# Patient Record
Sex: Female | Born: 1985 | Race: White | Hispanic: No | Marital: Married | Smoking: Current every day smoker
Health system: Southern US, Community
[De-identification: ages and names within clinical notes are randomized; demographics above are authoritative.]

## PROBLEM LIST (undated history)

## (undated) DIAGNOSIS — F1011 Alcohol abuse, in remission: Secondary | ICD-10-CM

## (undated) DIAGNOSIS — K219 Gastro-esophageal reflux disease without esophagitis: Secondary | ICD-10-CM

## (undated) DIAGNOSIS — I429 Cardiomyopathy, unspecified: Secondary | ICD-10-CM

## (undated) DIAGNOSIS — Z5189 Encounter for other specified aftercare: Secondary | ICD-10-CM

## (undated) DIAGNOSIS — I219 Acute myocardial infarction, unspecified: Secondary | ICD-10-CM

## (undated) DIAGNOSIS — F1110 Opioid abuse, uncomplicated: Secondary | ICD-10-CM

## (undated) HISTORY — PX: DILATION AND CURETTAGE OF UTERUS: SHX78

## (undated) HISTORY — DX: Encounter for other specified aftercare: Z51.89

## (undated) HISTORY — DX: Gastro-esophageal reflux disease without esophagitis: K21.9

---
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
## 2006-06-18 ENCOUNTER — Emergency Department (HOSPITAL_COMMUNITY): Admission: EM | Admit: 2006-06-18 | Discharge: 2006-06-18 | Payer: Self-pay | Admitting: Emergency Medicine

## 2007-03-25 ENCOUNTER — Emergency Department (HOSPITAL_COMMUNITY): Admission: EM | Admit: 2007-03-25 | Discharge: 2007-03-25 | Payer: Self-pay | Admitting: Emergency Medicine

## 2007-06-05 ENCOUNTER — Emergency Department (HOSPITAL_COMMUNITY): Admission: EM | Admit: 2007-06-05 | Discharge: 2007-06-06 | Payer: Self-pay | Admitting: Emergency Medicine

## 2007-08-01 ENCOUNTER — Observation Stay (HOSPITAL_COMMUNITY): Admission: EM | Admit: 2007-08-01 | Discharge: 2007-08-02 | Payer: Self-pay | Admitting: Emergency Medicine

## 2007-08-12 ENCOUNTER — Emergency Department (HOSPITAL_COMMUNITY): Admission: EM | Admit: 2007-08-12 | Discharge: 2007-08-13 | Payer: Self-pay | Admitting: Emergency Medicine

## 2008-03-10 ENCOUNTER — Emergency Department (HOSPITAL_COMMUNITY): Admission: EM | Admit: 2008-03-10 | Discharge: 2008-03-10 | Payer: Self-pay | Admitting: Emergency Medicine

## 2008-04-25 ENCOUNTER — Emergency Department (HOSPITAL_COMMUNITY): Admission: EM | Admit: 2008-04-25 | Discharge: 2008-04-25 | Payer: Self-pay | Admitting: Emergency Medicine

## 2008-05-26 ENCOUNTER — Emergency Department (HOSPITAL_COMMUNITY): Admission: EM | Admit: 2008-05-26 | Discharge: 2008-05-26 | Payer: Self-pay | Admitting: Family Medicine

## 2008-07-15 ENCOUNTER — Emergency Department (HOSPITAL_COMMUNITY): Admission: EM | Admit: 2008-07-15 | Discharge: 2008-07-15 | Payer: Self-pay | Admitting: *Deleted

## 2008-08-20 ENCOUNTER — Emergency Department (HOSPITAL_COMMUNITY): Admission: EM | Admit: 2008-08-20 | Discharge: 2008-08-20 | Payer: Self-pay | Admitting: Emergency Medicine

## 2008-09-22 ENCOUNTER — Emergency Department (HOSPITAL_BASED_OUTPATIENT_CLINIC_OR_DEPARTMENT_OTHER): Admission: EM | Admit: 2008-09-22 | Discharge: 2008-09-22 | Payer: Self-pay | Admitting: Emergency Medicine
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
## 2009-08-31 ENCOUNTER — Ambulatory Visit: Payer: Self-pay | Admitting: Radiology

## 2009-08-31 ENCOUNTER — Emergency Department (HOSPITAL_BASED_OUTPATIENT_CLINIC_OR_DEPARTMENT_OTHER): Admission: EM | Admit: 2009-08-31 | Discharge: 2009-08-31 | Payer: Self-pay | Admitting: Emergency Medicine

## 2010-11-13 LAB — DIFFERENTIAL
Basophils Relative: 1 % (ref 0–1)
Lymphocytes Relative: 34 % (ref 12–46)
Lymphs Abs: 1.6 10*3/uL (ref 0.7–4.0)
Monocytes Relative: 9 % (ref 3–12)
Neutro Abs: 2.7 10*3/uL (ref 1.7–7.7)
Neutrophils Relative %: 55 % (ref 43–77)

## 2010-11-13 LAB — URINALYSIS, ROUTINE W REFLEX MICROSCOPIC
Glucose, UA: NEGATIVE mg/dL
Hgb urine dipstick: NEGATIVE
Ketones, ur: 40 mg/dL — AB
Leukocytes, UA: NEGATIVE
Nitrite: NEGATIVE
Protein, ur: 30 mg/dL — AB
Specific Gravity, Urine: 1.027 (ref 1.005–1.030)
Urobilinogen, UA: 1 mg/dL (ref 0.0–1.0)
pH: 6.5 (ref 5.0–8.0)

## 2010-11-13 LAB — POCT I-STAT, CHEM 8
BUN: <3 mg/dL — ABNORMAL LOW (ref 6–23)
Chloride: 104 mEq/L (ref 96–112)
Creatinine, Ser: 0.7 mg/dL (ref 0.4–1.2)
Glucose, Bld: 79 mg/dL (ref 70–99)
HCT: 43 % (ref 36.0–46.0)
Potassium: 3.8 mEq/L (ref 3.5–5.1)

## 2010-11-13 LAB — CBC
RBC: 4.25 MIL/uL (ref 3.87–5.11)
WBC: 4.8 10*3/uL (ref 4.0–10.5)

## 2010-11-13 LAB — URINE MICROSCOPIC-ADD ON

## 2010-11-13 LAB — RAPID URINE DRUG SCREEN, HOSP PERFORMED
Amphetamines: NOT DETECTED
Barbiturates: NOT DETECTED
Benzodiazepines: POSITIVE — AB
Opiates: NOT DETECTED
Tetrahydrocannabinol: POSITIVE — AB

## 2010-11-13 LAB — POCT PREGNANCY, URINE: Preg Test, Ur: NEGATIVE

## 2010-11-14 LAB — PREGNANCY, URINE: Preg Test, Ur: NEGATIVE

## 2010-12-12 NOTE — H&P (Signed)
[name], [name]            ACCOUNT NO.:  [id number]   MEDICAL RECORD NO.:  [id number]          PATIENT TYPE:  INP   LOCATION:  1823                         FACILITY:  [facility]   PHYSICIAN:  Marcellus [place], MD     DATE OF BIRTH:  25-Mar-1986   DATE OF ADMISSION:  07/31/2007  DATE OF DISCHARGE:                              HISTORY & PHYSICAL   PRIMARY CARE PHYSICIAN:  Karyl Kinnier, M.D., in [facility], [place], [place].   PSYCHIATRIST:  Vear Clock, M.D.   CHIEF COMPLAINT:  1. Overdose on Benadryl with alcohol.  2. Attempted suicide.   HISTORY OF PRESENT ILLNESS:  [name] is a 25 year old Caucasian  female patient with history of bipolar disorder, panic disorder, who was  in her usual state of health until 6:49 p.m. when she ingested 24  tablets of 25 mg each of Benadryl with a small amount of alcohol.  She  claims that she has been depressed secondary to her personal relations  and wanted to get away from all of that and [name] to sleep.  She is unsure  if she really attempted to commit suicide.  In any event,  within 10  minutes,  when her friend,  June,  got there,  she mentioned what she  had done to her following which her friend brought her to the emergency  room.  In the emergency room, the patient initially had some drowsiness,  dry mouth, palpitations and feeling jittery.  However,  now,  she says  her drowsiness has resolved and so has her jitteriness and palpitations.  The patient still has some dry mouth.  She denies any other complaints.  She denies any homicidal ideations,  illusions or hallucinations.   PAST MEDICAL HISTORY:  Question hypoglycemia as a child.   PAST SURGICAL HISTORY:  Right ear surgery as a baby.   PSYCHIATRIC HISTORY:  1. Bipolar disorder.  2. Panic disorder.  3. Insomnia.   ALLERGIES:  Latex.  No known drug allergies.   MEDICATIONS:  1. Wellbutrin XL 150 mg p.o. daily.  2. Depakote ER 1000 mg p.o.  q.h.s.  3. Ambien 5 mg  p.o. q.h.s.  4. Klonopin 1 mg p.o. b.i.d.   FAMILY HISTORY:  His mother who is said to be dying from hepatitis C,  heart disease and liver disease.   SOCIAL HISTORY:  The patient is single.  She lives with her roommates.  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  She smokes half to 1 pack  of cigarettes per day for the last 5 years.  She also smokes pot.  She  denies regular alcohol intake.   REVIEW OF SYSTEMS:  Fourteen systems reviewed and apart from history of  present illness is noncontributory.   PHYSICAL EXAMINATION:  [name] is a pleasant moderately built and  nourished female, patient in no obvious distress.  VITAL SIGNS:  Temperature 98.7 degrees Fahrenheit, blood pressure 117/70  mmHg, pulse which was in the 140s on arrival is in the 80s and regular  at this time, respirations 16 per minute, saturating at 100%  on room  air.  HEAD, EYES, ENT:  Nontraumatic, normocephalic.  Pupils equally reacting  to light and accommodation.  NECK:  Without JVD, carotid bruit, lymphadenopathy, or goiter.  Supple.  LYMPHATICS:  No lymphadenopathy.  BREAST EXAM:  Deferred.  RESPIRATORY SYSTEM:  Clear to auscultation.  CARDIOVASCULAR SYSTEM:  First and second heart sounds heard.  No third  or fourth heart sounds or murmurs.  CENTRAL NERVOUS SYSTEM:  The patient is awake, alert, oriented times 3  with no focal neurological deficits.  EXTREMITIES:  No cyanosis, clubbing, edema.  Peripheral pulses are  symmetrically felt.  SKIN:  Normal warmth and moisture.  MUSCULOSKELETAL SYSTEM:  Noncontributory.  Abdomen; nondisteded, nontender. No organomegaly/mass felt. Bowel sounds  normal.   LABS:  Urine drug screen positive for amphetamines, opiates,  tetrahydrocannabinol.  Serum salicylate level, acetaminophen levels  normal.  Blood alcohol  level 17 mg per dL.  Comprehensive metabolic  panel is only remarkable for glucose of 66.  Her BUN is 6, creatinine   0.65.  Urine pregnancy test is negative.  CBCs are unremarkable.   ASSESSMENT AND PLAN:  1. Drug overdose of Benadryl with alcohol.  The patient still with      some anticholinergic side effects.  Will admit to telemetry for      observation and supportive management.  ED physician has discussed      her case with Poison Control who have not indicated any active      interventions.  2. Attempted suicide.  Will place on suicide precautions and one-on-      one sitter.  Will obtain psychiatric consult.  3. Polysubstance abuse-for counseling.  4. Tobacco abuse-for cessation counseling and nicotine patch.  5. Bipolar disorder-to continue the patient's home medications.      Marcellus [place], MD  Electronically Signed     AH/MEDQ  D:  08/01/2007  T:  08/01/2007  Job:  [id number]   cc:   Karyl Kinnier
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
## 2011-04-19 LAB — CBC
HCT: 31.6 — ABNORMAL LOW
HCT: 39
Hemoglobin: 11 — ABNORMAL LOW
Hemoglobin: 13.3
MCHC: 34
MCHC: 35
MCV: 94.2
MCV: 94.7
MCV: 96.1
Platelets: 244
Platelets: 274
RBC: 3.36 — ABNORMAL LOW
RBC: 4.06
RBC: 4.67
RDW: 12.9
WBC: 7.6
WBC: 7.9

## 2011-04-19 LAB — DIFFERENTIAL
Basophils Absolute: 0
Basophils Relative: 1
Eosinophils Absolute: 0
Eosinophils Absolute: 0
Eosinophils Relative: 0
Eosinophils Relative: 1
Lymphocytes Relative: 29
Lymphocytes Relative: 37
Lymphs Abs: 2.2
Lymphs Abs: 2.9
Monocytes Absolute: 0.6
Monocytes Absolute: 0.7
Monocytes Relative: 9
Neutro Abs: 4.2
Neutrophils Relative %: 53

## 2011-04-19 LAB — COMPREHENSIVE METABOLIC PANEL
ALT: 13
ALT: 8
AST: 26
Albumin: 5.5 — ABNORMAL HIGH
BUN: 4 — ABNORMAL LOW
CO2: 27
Calcium: 10.2
Calcium: 8.4
Chloride: 102
Creatinine, Ser: 0.65
Creatinine, Ser: 0.73
GFR calc Af Amer: >60
GFR calc non Af Amer: >60
Glucose, Bld: 84
Sodium: 137
Total Bilirubin: 1

## 2011-04-19 LAB — URINALYSIS, ROUTINE W REFLEX MICROSCOPIC
Glucose, UA: NEGATIVE
Hgb urine dipstick: NEGATIVE
Ketones, ur: 15 — AB
Nitrite: NEGATIVE
Protein, ur: NEGATIVE
Specific Gravity, Urine: 1.028
Urobilinogen, UA: 1
pH: 7

## 2011-04-19 LAB — I-STAT 8, (EC8 V) (CONVERTED LAB)
BUN: 11
Bicarbonate: 28.6 — ABNORMAL HIGH
HCT: 43
Hemoglobin: 14.6
Operator id: 196461
Sodium: 139
TCO2: 30

## 2011-04-19 LAB — RAPID URINE DRUG SCREEN, HOSP PERFORMED
Amphetamines: NOT DETECTED
Benzodiazepines: NOT DETECTED
Benzodiazepines: POSITIVE — AB
Cocaine: NOT DETECTED
Cocaine: NOT DETECTED
Opiates: POSITIVE — AB
Tetrahydrocannabinol: POSITIVE — AB
Tetrahydrocannabinol: POSITIVE — AB

## 2011-04-19 LAB — POCT PREGNANCY, URINE
Operator id: 19646
Preg Test, Ur: NEGATIVE

## 2011-04-19 LAB — POCT I-STAT CREATININE: Creatinine, Ser: 0.8

## 2011-04-19 LAB — D-DIMER, QUANTITATIVE: D-Dimer, Quant: 0.23

## 2011-04-30 LAB — WET PREP, GENITAL: WBC, Wet Prep HPF POC: NONE SEEN

## 2011-04-30 LAB — POCT URINALYSIS DIP (DEVICE)
Nitrite: NEGATIVE
Protein, ur: NEGATIVE
pH: 6.5

## 2011-04-30 LAB — GC/CHLAMYDIA PROBE AMP, GENITAL: Chlamydia, DNA Probe: NEGATIVE

## 2011-04-30 LAB — POCT PREGNANCY, URINE: Preg Test, Ur: NEGATIVE

## 2011-05-08 LAB — DIFFERENTIAL
Basophils Relative: 0
Monocytes Absolute: 0.8 — ABNORMAL HIGH
Monocytes Relative: 7
Neutro Abs: 8.3 — ABNORMAL HIGH

## 2011-05-08 LAB — COMPREHENSIVE METABOLIC PANEL
Alkaline Phosphatase: 55
BUN: 8
CO2: 25
GFR calc non Af Amer: >60
Glucose, Bld: 91
Potassium: 4.3
Total Bilirubin: 0.7
Total Protein: 7.4

## 2011-05-08 LAB — CBC
HCT: 40.9
Hemoglobin: 14.3
RBC: 4.32
RDW: 12.3

## 2011-05-08 LAB — RAPID URINE DRUG SCREEN, HOSP PERFORMED
Amphetamines: NOT DETECTED
Barbiturates: NOT DETECTED
Benzodiazepines: NOT DETECTED
Opiates: NOT DETECTED

## 2011-05-08 LAB — ETHANOL: Alcohol, Ethyl (B): 48 — ABNORMAL HIGH

## 2011-05-31 ENCOUNTER — Emergency Department (HOSPITAL_COMMUNITY)
Admission: EM | Admit: 2011-05-31 | Discharge: 2011-05-31 | Disposition: A | Discharge status: Home / Self Care | Payer: Medicaid Other | Attending: Emergency Medicine | Admitting: Emergency Medicine

## 2011-05-31 DIAGNOSIS — T148XXA Other injury of unspecified body region, initial encounter: Secondary | ICD-10-CM | POA: Insufficient documentation

## 2011-05-31 DIAGNOSIS — F411 Generalized anxiety disorder: Secondary | ICD-10-CM | POA: Insufficient documentation

## 2012-10-20 ENCOUNTER — Encounter (HOSPITAL_BASED_OUTPATIENT_CLINIC_OR_DEPARTMENT_OTHER): Payer: Self-pay

## 2012-10-20 ENCOUNTER — Emergency Department (HOSPITAL_BASED_OUTPATIENT_CLINIC_OR_DEPARTMENT_OTHER)
Admission: EM | Admit: 2012-10-20 | Discharge: 2012-10-20 | Disposition: A | Discharge status: Home / Self Care | Payer: Self-pay | Attending: Emergency Medicine | Admitting: Emergency Medicine

## 2012-10-20 DIAGNOSIS — R05 Cough: Secondary | ICD-10-CM | POA: Insufficient documentation

## 2012-10-20 DIAGNOSIS — Z79899 Other long term (current) drug therapy: Secondary | ICD-10-CM | POA: Insufficient documentation

## 2012-10-20 DIAGNOSIS — J069 Acute upper respiratory infection, unspecified: Secondary | ICD-10-CM | POA: Insufficient documentation

## 2012-10-20 DIAGNOSIS — J3489 Other specified disorders of nose and nasal sinuses: Secondary | ICD-10-CM | POA: Insufficient documentation

## 2012-10-20 DIAGNOSIS — Z8679 Personal history of other diseases of the circulatory system: Secondary | ICD-10-CM | POA: Insufficient documentation

## 2012-10-20 DIAGNOSIS — R059 Cough, unspecified: Secondary | ICD-10-CM | POA: Insufficient documentation

## 2012-10-20 DIAGNOSIS — J209 Acute bronchitis, unspecified: Secondary | ICD-10-CM | POA: Insufficient documentation

## 2012-10-20 DIAGNOSIS — F17200 Nicotine dependence, unspecified, uncomplicated: Secondary | ICD-10-CM | POA: Insufficient documentation

## 2012-10-20 DIAGNOSIS — R51 Headache: Secondary | ICD-10-CM | POA: Insufficient documentation

## 2012-10-20 DIAGNOSIS — J029 Acute pharyngitis, unspecified: Secondary | ICD-10-CM | POA: Insufficient documentation

## 2012-10-20 HISTORY — DX: Cardiomyopathy, unspecified: I42.9

## 2012-10-20 HISTORY — DX: Opioid abuse, uncomplicated: F11.10

## 2012-10-20 HISTORY — DX: Alcohol abuse, in remission: F10.11

## 2012-10-20 MED ORDER — IBUPROFEN 800 MG PO TABS
800.0000 mg | ORAL_TABLET | Freq: Once | ORAL | Status: AC
  Administered 2012-10-20: 800 mg via ORAL
  Filled 2012-10-20: qty 1

## 2012-10-20 MED ORDER — PREDNISONE 50 MG PO TABS
60.0000 mg | ORAL_TABLET | Freq: Once | ORAL | Status: AC
  Administered 2012-10-20: 60 mg via ORAL
  Filled 2012-10-20: qty 1

## 2012-10-20 MED ORDER — ALBUTEROL SULFATE (5 MG/ML) 0.5% IN NEBU
2.5000 mg | INHALATION_SOLUTION | Freq: Once | RESPIRATORY_TRACT | Status: AC
  Administered 2012-10-20: 2.5 mg via RESPIRATORY_TRACT
  Filled 2012-10-20: qty 0.5

## 2012-10-20 MED ORDER — ALBUTEROL SULFATE HFA 108 (90 BASE) MCG/ACT IN AERS
2.0000 | INHALATION_SPRAY | RESPIRATORY_TRACT | Status: DC | PRN
Start: 2012-10-20 — End: 2012-10-20
  Administered 2012-10-20: 2 via RESPIRATORY_TRACT
  Filled 2012-10-20: qty 6.7

## 2012-10-20 MED ORDER — IPRATROPIUM BROMIDE 0.02 % IN SOLN
0.5000 mg | Freq: Once | RESPIRATORY_TRACT | Status: AC
Start: 2012-10-20 — End: 2012-10-20
  Administered 2012-10-20: 0.5 mg via RESPIRATORY_TRACT
  Filled 2012-10-20: qty 2.5

## 2012-10-20 MED ORDER — PREDNISONE 20 MG PO TABS: 60.0000 mg | ORAL_TABLET | Freq: Every day | ORAL | Status: DC

## 2012-10-20 MED ORDER — ACETAMINOPHEN 325 MG PO TABS
650.0000 mg | ORAL_TABLET | Freq: Once | ORAL | Status: AC
  Administered 2012-10-20: 650 mg via ORAL
  Filled 2012-10-20: qty 2

## 2012-10-20 NOTE — ED Notes (Signed)
RT Note: Patient was instructed on proper MDI use with spacer. Patient demonstrated technique well and currently is in no apparent respiratory distress. Rt will continue to monitor.

## 2012-10-20 NOTE — ED Provider Notes (Signed)
History     CSN: [id number]  Arrival date & time 10/20/12  0908   First MD Initiated Contact with Patient 10/20/12 [phone]      Chief Complaint  Patient presents with  . URI    (Consider location/radiation/quality/duration/timing/severity/associated sxs/prior treatment) Patient is a 27 y.o. female presenting with URI. The history is provided by the patient.  URI She has been sick for the last 2 days with sore throat, nonproductive cough, nasal congestion, and headache. She has not had fever or chills but she did have sweats on one occasion. Denies nausea or vomiting or diarrhea. There's been some mild mid abdominal pain. She denies arthralgias or myalgias. She denies dyspnea. She denies sick contacts. She does smoke one pack of cigarettes a day. She is using over-the-counter cough syrups and Mucinex with no relief.  Past Medical History  Diagnosis Date  . H/O ETOH abuse   . Cardiomyopathy   . Opiate abuse, episodic     Past Surgical History  Procedure Laterality Date  . Dilation and curettage of uterus      History reviewed. No pertinent family history.  History  Substance Use Topics  . Smoking status: Current Every Day Smoker -- 1.00 packs/day for 10 years    Types: Cigarettes  . Smokeless tobacco: Never Used  . Alcohol Use: No     Comment: etoh abuse (recovering since 10/02/2012)    OB History   Grav Para Term Preterm Abortions TAB SAB Ect Mult Living                  Review of Systems  All other systems reviewed and are negative.    Allergies  Latex  Home Medications   Current Outpatient Rx  Name  Route  Sig  Dispense  Refill  . Citalopram Hydrobromide (CELEXA PO)   Oral   Take by mouth daily.         Marland Kitchen gabapentin (NEURONTIN) 300 MG capsule   Oral   Take 300 mg by mouth 3 (three) times daily.         . QUEtiapine (SEROQUEL) 100 MG tablet   Oral   Take 100 mg by mouth at bedtime.         Marland Kitchen levonorgestrel (MIRENA) 20 MCG/24HR IUD  Intrauterine   1 each by Intrauterine route once.           BP 128/74  Pulse 104  Temp(Src) 97.7 F (36.5 C) (Oral)  Resp 18  Ht 5\' 5"  (1.651 m)  Wt 150 lb (68.04 kg)  BMI 24.96 kg/m2  SpO2 100%  Physical Exam  Nursing note and vitals reviewed.  27 year old female, resting comfortably and in no acute distress. Vital signs are significant for borderline tachycardia with heart rate 104. Oxygen saturation is 100%, which is normal. Head is normocephalic and atraumatic. PERRLA, EOMI. Oropharynx shows mild tonsillar hypertrophy without exudate. Neck is nontender and supple without adenopathy or JVD. Back is nontender and there is no CVA tenderness. Lungs are clear without rales, wheezes, or rhonchi. Prolonged exhalation phase is noted. Chest is nontender. Heart has regular rate and rhythm without murmur. Abdomen is soft, flat, nontender without masses or hepatosplenomegaly and peristalsis is normoactive. Extremities have no cyanosis or edema, full range of motion is present. Skin is warm and dry without rash. Neurologic: Mental status is normal, cranial nerves are intact, there are no motor or sensory deficits.  ED Course  Procedures (including critical care time)  Results for  orders placed during the hospital encounter of 10/20/12  RAPID STREP SCREEN      Result Value Range   Streptococcus, Group A Screen (Direct) NEGATIVE  NEGATIVE      1. Upper respiratory infection   2. Acute bronchitis       MDM  Upper respiratory infection with bronchitis. Strep screen will be obtained and she'll be given a therapeutic trial of albuterol with Atrovent.  She had significant relief with albuterol with Atrovent. She'll be given an albuterol inhaler to use at home. She is given a prescription for prednisone and is to use over-the-counter ibuprofen and acetaminophen for pain.  Dione Booze, MD 10/20/12 1040

## 2012-10-20 NOTE — ED Notes (Signed)
Pt states that she has dry non productive cough x2 days, congestion of head and chest, denies nausea, c/o mild abdominal pain and chest wall pain when coughing, and generalized malaise.

## 2013-10-13 ENCOUNTER — Emergency Department (HOSPITAL_COMMUNITY)
Admission: EM | Admit: 2013-10-13 | Discharge: 2013-10-13 | Disposition: A | Discharge status: Home / Self Care | Payer: Medicaid Other | Attending: Emergency Medicine | Admitting: Emergency Medicine

## 2013-10-13 ENCOUNTER — Encounter (HOSPITAL_COMMUNITY): Payer: Self-pay | Admitting: Emergency Medicine

## 2013-10-13 DIAGNOSIS — Z91040 Latex allergy status: Secondary | ICD-10-CM | POA: Insufficient documentation

## 2013-10-13 DIAGNOSIS — Z79899 Other long term (current) drug therapy: Secondary | ICD-10-CM | POA: Insufficient documentation

## 2013-10-13 DIAGNOSIS — I252 Old myocardial infarction: Secondary | ICD-10-CM | POA: Insufficient documentation

## 2013-10-13 DIAGNOSIS — F17200 Nicotine dependence, unspecified, uncomplicated: Secondary | ICD-10-CM | POA: Insufficient documentation

## 2013-10-13 DIAGNOSIS — F1120 Opioid dependence, uncomplicated: Secondary | ICD-10-CM | POA: Insufficient documentation

## 2013-10-13 DIAGNOSIS — IMO0002 Reserved for concepts with insufficient information to code with codable children: Secondary | ICD-10-CM | POA: Insufficient documentation

## 2013-10-13 HISTORY — DX: Acute myocardial infarction, unspecified: I21.9

## 2013-10-13 NOTE — Progress Notes (Signed)
[facility] to help establish primary care. Also, spoke with patient about [facility] and explained that they would help patient with substance abuse counseling. Patient is drowsy and kept dosing off during conversation.
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
## 2013-10-13 NOTE — Progress Notes (Signed)
   CARE MANAGEMENT ED NOTE 10/13/2013  Patient:  Dianne DunRANSEAU,Leela N   Account Number:  [id number]  Date Initiated:  10/13/2013  Documentation initiated by:  Edd ArbourGIBBS,KIMBERLY  Subjective/Objective Assessment:   28 yr [place] pt without a pcp listed Pt confirmed with ED CM that she no longer has medicaid Pt agreed to a referral to [facility] staff c/o wanting detox O2 sat 89%     Subjective/Objective Assessment Detail:   used Heroin approx 45 minutes ago. patient is drowsy States boyfriend with dx hep c     Action/Plan:   spoke with pt [facility]   Action/Plan Detail:   Anticipated DC Date:       Status Recommendation to Physician:   Result of Recommendation:    Other ED Services  Consult Working Plan    DC Planning Services  Other  PCP issues  Outpatient Services - Pt will follow up  [facility]new[facility]    Choice offered to / List presented to:            Status of service:  Completed, signed off  ED Comments:   ED Comments Detail:

## 2013-10-13 NOTE — Discharge Instructions (Signed)
Finding Treatment for Alcohol and Drug Addiction It can be hard to find the right place to get professional treatment. Here are some important things to consider:  There are different types of treatment to choose from.  Some programs are live-in (residential) while others are not (outpatient). Sometimes a combination is offered.  No single type of program is right for everyone.  Most treatment programs involve a combination of education, counseling, and a 12-step, spiritually-based approach.  There are non-spiritually based programs (not 12-step).  Some treatment programs are government sponsored. They are geared for patients without private insurance.  Treatment programs can vary in many respects such as:  Cost and types of insurance accepted.  Types of on-site medical services offered.  Length of stay, setting, and size.  Overall philosophy of treatment. A person may need specialized treatment or have needs not addressed by all programs. For example, adolescents need treatment appropriate for their age. Other people have secondary disorders that must be managed as well. Secondary conditions can include mental illness, such as depression or diabetes. Often, a period of detoxification from alcohol or drugs is needed. This requires medical supervision and not all programs offer this. THINGS TO CONSIDER WHEN SELECTING A TREATMENT PROGRAM   Is the program certified by the appropriate government agency? Even private programs must be certified and employ certified professionals.  Does the program accept your insurance? If not, can a payment plan be set up?  Is the facility clean, organized, and well run? Do they allow you to speak with graduates who can share their treatment experience with you? Can you tour the facility? Can you meet with staff?  Does the program meet the full range of individual needs?  Does the treatment program address sexual orientation and physical disabilities?  Do they provide age, gender, and culturally appropriate treatment services?  Is treatment available in languages other than English?  Is long-term aftercare support or guidance encouraged and provided?  Is assessment of an individual's treatment plan ongoing to ensure it meets changing needs?  Does the program use strategies to encourage reluctant patients to remain in treatment long enough to increase the likelihood of success?  Does the program offer counseling (individual or group) and other behavioral therapies?  Does the program offer medicine as part of the treatment regimen, if needed?  Is there ongoing monitoring of possible relapse? Is there a defined relapse prevention program? Are services or referrals offered to family members to ensure they understand addiction and the recovery process? This would help them support the recovering individual.  Are 12-step meetings held at the center or is transport available for patients to attend outside meetings? In countries outside of the [place]. and [place], Magazine features editor for contact information for services in your area. Document Released: 06/14/2005 Document Revised: 10/08/2011 Document Reviewed: 12/25/2007 [facility] Patient Information 2014 [place], [place].      Behavioral Health Resources in the [facility]  Intensive Outpatient Programs: [facility]      [address] [place], [place] [phone] Both a day and evening program       [facility] Outpatient     [address]        [place], [place] [address] [phone]         ADS: [facility] [address] [place] [phone]  [facility] ACCESS LINE: [phone] or [phone] [address] [place], [place] [address] [email or web address]  Mobile  Crisis Teams:                                        [facility]         Mobile Crisis Care  Unit [phone]             [facility] [address] [place] [phone]                                         Interventionist [address] [place] [phone]  Self-Help/Support Groups: [facility] [place] Variety of support groups [phone] (call for more info)  Narcotics Anonymous (NA) [facility] [address] [place] - 2 meetings at this location  Residential Treatment Programs:  [facility]      [address]        [place]       [phone]         [facility] [address], [place] [address] [place], [place]  [address] [phone]  [facility]  [address] [place], [place] [address] [phone] Admissions: 8am-3pm M-F  [facility]     [address]        [place], [place] [address]       [phone]         [facility] [address] [place], [place] [phone]  [facility] [address] [place], [place] [phone]  [facility] - Intensive Outpatient      [address]     [place], [place]       [phone]         [facility] ([facility]     [address] [place], [place] [phone] or [phone]  [facility] ([facility])  [address] [place], [place] [phone]  [facility] [address]                                               [address] [place] [phone]  [facility]: [phone]- [phone]               General Therapy                                                Angie FavaJulie Brannon, PhD        [address]                                       [place], [place] [address]         [phone]   Insurance  [facility]   [address] [place], [place] [address] [phone]  [facility] [address]  [place], [place] [address] [phone] Insurance/Medicaid/sponsorship through [facility]  [address]                                        [place], [place] [address]    Therapy/tele-psych/case         [phone]          [facility] [address]   [place], [place]  [address]  Adolescent/group home/case management [phone]                                           Creola CornJulia Brannon PhD       General therapy       Insurance   [phone]         Dr. Lolly MustacheArfeen Insurance [phone] M-F  [place] [facility] Medicaid, sponsorship [phone]

## 2013-10-13 NOTE — ED Provider Notes (Addendum)
CSN: [id number]     Arrival date & time 10/13/13  1340 History   First MD Initiated Contact with Patient 10/13/13 1402     Chief Complaint  Patient presents with  . Medical Clearance    heroin overdose     (Consider location/radiation/quality/duration/timing/severity/associated sxs/prior Treatment) The history is provided by the patient and the police.   patient here requesting heroin detox. Last use was 45 minutes ago. States that she was not attempting to take her life in that she was only wanting to become intoxicated. Denies alcohol use. Her friends called EMS and I spoke with them and she did not receive heroin. She is sleepy but easily arousable. Denies any chest pain or dyspnea. No abdominal pain.  Past Medical History  Diagnosis Date  . H/O ETOH abuse   . Cardiomyopathy   . Opiate abuse, episodic   . MI (myocardial infarction)    Past Surgical History  Procedure Laterality Date  . Dilation and curettage of uterus     Family History  Problem Relation Age of Onset  . Family history unknown: Yes   History  Substance Use Topics  . Smoking status: Current Every Day Smoker -- 1.00 packs/day for 10 years    Types: Cigarettes  . Smokeless tobacco: Never Used  . Alcohol Use: No     Comment: etoh abuse (recovering since 10/02/2012)   OB History   Grav Para Term Preterm Abortions TAB SAB Ect Mult Living                 Review of Systems  All other systems reviewed and are negative.      Allergies  Latex  Home Medications   Current Outpatient Rx  Name  Route  Sig  Dispense  Refill  . Citalopram Hydrobromide (CELEXA PO)   Oral   Take by mouth daily.         Marland Kitchen gabapentin (NEURONTIN) 300 MG capsule   Oral   Take 300 mg by mouth 3 (three) times daily.         Marland Kitchen levonorgestrel (MIRENA) 20 MCG/24HR IUD   Intrauterine   1 each by Intrauterine route once.         . predniSONE (DELTASONE) 20 MG tablet   Oral   Take 3 tablets (60 mg total) by mouth daily.   15 tablet   0   . QUEtiapine (SEROQUEL) 100 MG tablet   Oral   Take 100 mg by mouth at bedtime.          BP 113/69  Pulse 102  Temp(Src) 98 F (36.7 C) (Oral)  Resp 18  SpO2 89% Physical Exam  Nursing note and vitals reviewed. Constitutional: She is oriented to person, place, and time. She appears well-developed and well-nourished.  Non-toxic appearance. No distress.  HENT:  Head: Normocephalic and atraumatic.  Eyes: Conjunctivae, EOM and lids are normal. Pupils are equal, round, and reactive to light.  Neck: Normal range of motion. Neck supple. No tracheal deviation present. No mass present.  Cardiovascular: Normal rate, regular rhythm and normal heart sounds.  Exam reveals no gallop.   No murmur heard. Pulmonary/Chest: Effort normal and breath sounds normal. No stridor. No respiratory distress. She has no decreased breath sounds. She has no wheezes. She has no rhonchi. She has no rales.  Abdominal: Soft. Normal appearance and bowel sounds are normal. She exhibits no distension. There is no tenderness. There is no rebound and no CVA tenderness.  Musculoskeletal: Normal range  of motion. She exhibits no edema and no tenderness.  Neurological: She is alert and oriented to person, place, and time. She has normal strength. No cranial nerve deficit or sensory deficit. GCS eye subscore is 4. GCS verbal subscore is 5. GCS motor subscore is 6.  Skin: Skin is warm and dry. No abrasion and no rash noted.  Psychiatric: Her affect is blunt. Her speech is delayed. She is slowed. She expresses no suicidal plans.    ED Course  Procedures (including critical care time) Labs Review Labs Reviewed  CBC WITH DIFFERENTIAL  COMPREHENSIVE METABOLIC PANEL  URINE RAPID DRUG SCREEN (HOSP PERFORMED)  ETHANOL   Imaging Review No results found.   EKG Interpretation None      MDM   Final diagnoses:  None    Patient to be given outpatient resources for opiate detox. No acute psychiatric  emergency present. Will be discharged when stable.  3:23 PM Patient reassessed and is able to get sounds at this time. Pulse oximetry stable. She is stable for discharge   Toy BakerAnthony T Ariam Mol, MD 10/13/13 1412  Toy BakerAnthony T Treyshaun Keatts, MD 10/13/13 1524

## 2014-05-19 ENCOUNTER — Ambulatory Visit (INDEPENDENT_AMBULATORY_CARE_PROVIDER_SITE_OTHER): Payer: Self-pay | Admitting: Family

## 2014-05-19 ENCOUNTER — Encounter: Payer: Self-pay | Admitting: Family

## 2014-05-19 VITALS — BP 148/92 | HR 110 | Temp 98.5°F | Resp 20 | Ht 64.0 in | Wt 157.1 lb

## 2014-05-19 DIAGNOSIS — R000 Tachycardia, unspecified: Secondary | ICD-10-CM

## 2014-05-19 DIAGNOSIS — F419 Anxiety disorder, unspecified: Secondary | ICD-10-CM

## 2014-05-19 DIAGNOSIS — M545 Low back pain, unspecified: Secondary | ICD-10-CM | POA: Insufficient documentation

## 2014-05-19 MED ORDER — OXYCODONE HCL 30 MG PO TABS: 30.0000 mg | ORAL_TABLET | ORAL | Status: DC | PRN

## 2014-05-19 MED ORDER — ALPRAZOLAM 1 MG PO TABS: 1.0000 mg | ORAL_TABLET | Freq: Every evening | ORAL | Status: DC | PRN

## 2014-05-19 NOTE — Assessment & Plan Note (Signed)
HR still remains tachycardic. Currently taking propanolol. Cannot rule out tachycardia as a result of anxiety. If symptoms persist will perform an EKG. Continue current propanolol at this time.
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
## 2014-05-19 NOTE — Assessment & Plan Note (Signed)
Continues to experience pain even with current dose of oxycodone. [facility]. Refer to pain specialist for maintenance of medication. Controlled substances agreement signed.
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
## 2014-05-19 NOTE — Patient Instructions (Signed)
Thank you for choosing [facility].  Summary/Instructions:   You should hear back in about a week regarding pain management if you have not please let us know.  Continue to take your medication as needed.  Please make an appointment for a physical at your convenience.  Thank you for enrolling in MyChart. Please follow the instructions below to securely access your online medical record. MyChart allows you to send messages to your doctor, view your test results, renew your prescriptions, schedule appointments, and more.  How Do I Sign Up? 1. In your Internet browser, go to [email or web address]. 2. Click on the New  User? link in the Sign In box.  3. Enter your MyChart Access Code exactly as it appears below. You will not need to use this code after you have completed the sign-up process. If you do not sign up before the expiration date, you must request a new code. MyChart Access Code: [email or web address] Expires: 07/18/2014 10:03 AM  4. Enter the last four digits of your Social Security Number (xxxx) and Date of Birth (mm/dd/yyyy) as indicated and click Next. You will be taken to the next sign-up page. 5. Create a MyChart ID. This will be your MyChart login ID and cannot be changed, so think of one that is secure and easy to remember. 6. Create a MyChart password. You can change your password at any time. 7. Enter your Password Reset Question and Answer and click Next. This can be used at a later time if you forget your password.  8. Select your communication preference, and if applicable enter your e-mail address. You will receive e-mail notification when new information is available in MyChart by choosing to receive e-mail notifications and filling in your e-mail. 9. Click Sign In. You can now view your medical record.   Additional Information If you have questions, you can email [email or web address] or call [phone] to talk to our MyChart staff.  Remember, MyChart is NOT to be used for urgent needs. For medical emergencies, dial 911.

## 2014-05-20 ENCOUNTER — Telehealth: Payer: Self-pay | Admitting: Family

## 2014-05-20 NOTE — Telephone Encounter (Signed)
emmi mailed  °

## 2014-06-10 ENCOUNTER — Other Ambulatory Visit: Payer: Self-pay

## 2014-06-10 ENCOUNTER — Other Ambulatory Visit: Payer: Medicaid Other

## 2014-06-10 ENCOUNTER — Ambulatory Visit (INDEPENDENT_AMBULATORY_CARE_PROVIDER_SITE_OTHER): Payer: Self-pay | Admitting: Family

## 2014-06-10 ENCOUNTER — Encounter: Payer: Self-pay | Admitting: Family

## 2014-06-10 VITALS — BP 164/112 | HR 116 | Temp 97.9°F | Resp 18 | Ht 64.0 in | Wt 157.8 lb

## 2014-06-10 DIAGNOSIS — N898 Other specified noninflammatory disorders of vagina: Secondary | ICD-10-CM

## 2014-06-10 DIAGNOSIS — M545 Low back pain, unspecified: Secondary | ICD-10-CM

## 2014-06-10 DIAGNOSIS — F419 Anxiety disorder, unspecified: Secondary | ICD-10-CM

## 2014-06-10 MED ORDER — OXYCODONE HCL 30 MG PO TABS: 30.0000 mg | ORAL_TABLET | ORAL | Status: DC | PRN

## 2014-06-10 MED ORDER — METRONIDAZOLE 500 MG PO TABS: 500.0000 mg | ORAL_TABLET | Freq: Two times a day (BID) | ORAL | Status: DC

## 2014-06-10 MED ORDER — ALPRAZOLAM 1 MG PO TABS: 1.0000 mg | ORAL_TABLET | Freq: Every evening | ORAL | Status: DC | PRN

## 2014-06-10 NOTE — Assessment & Plan Note (Signed)
Wet prep obtained. Start metronidazole. Obtain GC/Ch urine probe to rule out other STDs. Will start secondary antibiotic if comes back positive. Follow up if symptoms worsen or fail to improve.

## 2014-06-10 NOTE — Assessment & Plan Note (Signed)
Continues to have low back spasms periodically. Pain has become so intense that it has caused her to quit her job. Pain clinic referral is in process. Pt has concern for cost of pain clinic visit. Refill oxycodone for 9 more days while awaiting pain clinic visit.

## 2014-06-10 NOTE — Patient Instructions (Signed)
Thank you for choosing [facility].  Summary/Instructions:   Please start taking the flagyl for 7 days  Please stop by the lab prior to leaving for your urine sample  We will be in touch with your results.   You should be hearing back from the pain clinic.   Follow up if symptoms worsen or fail to improve.

## 2014-06-10 NOTE — Assessment & Plan Note (Signed)
Xanax currently used for sleep. Refill Xanax and continue as needed for sleep.

## 2014-06-10 NOTE — Progress Notes (Signed)
   Subjective:    Patient ID: Sandra [name], female    DOB: 1986/01/07, 28 y.o.   MRN: [id number]  Chief Complaint  Patient presents with  . Follow-up    needs xanax and oxycodone refill, also thinks she has BV    HPI:  Sandra [name] is a 28 y.o. female who presents today for follow up and discuss vaginal infection.  1) Back pain - Was working 12 hours at some points and was not able to tolerate the pain, resulting in her having to quit her job. Still awaiting referral to the pain clinic. Continues to have severe back spasms periodically. Pain managed with oxycodone for which she is requesting a refill from her 21 day supply given previously  2) Vaginal infection - Acute/chronic symptoms have been going on for approximately 2-3 months. Indicates she has mild amounts of green vaginal discharge at times. Denies any new partners. Has not done any treatments and there is nothing that makes it better or worse. Has had this once before was treated with Flagyl but does not recall diagnosis.   Allergies  Allergen Reactions  . Latex    Current Outpatient Prescriptions on File Prior to Visit  Medication Sig Dispense Refill  . propranolol (INDERAL) 10 MG tablet Take 10 mg by mouth 2 (two) times daily.     No current facility-administered medications on file prior to visit.   Review of Systems    See HPI Objective:    BP 164/112 mmHg  Pulse 116  Temp(Src) 97.9 F (36.6 C) (Oral)  Resp 18  Ht 5\' 4"  (1.626 m)  Wt 157 lb 12.8 oz (71.578 kg)  BMI 27.07 kg/m2  SpO2 97%  LMP 05/05/2014 Nursing note and vital signs reviewed. Female chaperone present for exam.   Physical Exam  Constitutional: She is oriented to person, place, and time. She appears well-developed and well-nourished. No distress.  Cardiovascular: Normal rate, regular rhythm, normal heart sounds and intact distal pulses.   Pulmonary/Chest: Effort normal and breath sounds normal.  Genitourinary: There is no  rash, tenderness or injury on the right labia. There is no rash, tenderness, lesion or injury on the left labia. Cervix exhibits no motion tenderness. There is bleeding (Currently on menstrual cycle) in the vagina. Vaginal discharge found.  Neurological: She is alert and oriented to person, place, and time.  Skin: Skin is warm and dry.  Psychiatric: She has a normal mood and affect. Her behavior is normal. Judgment and thought content normal.        Assessment & Plan:

## 2014-06-10 NOTE — Progress Notes (Signed)
Pre visit review using our clinic review tool, if applicable. No additional management support is needed unless otherwise documented below in the visit note. 

## 2014-06-11 LAB — WET PREP FOR TRICH, YEAST, CLUE
TRICH WET PREP: NONE SEEN
YEAST WET PREP: NONE SEEN

## 2014-06-11 LAB — GC/CHLAMYDIA PROBE AMP, URINE
Chlamydia, Swab/Urine, PCR: NEGATIVE
GC Probe Amp, Urine: NEGATIVE

## 2014-06-18 ENCOUNTER — Ambulatory Visit (INDEPENDENT_AMBULATORY_CARE_PROVIDER_SITE_OTHER): Payer: Self-pay | Admitting: Family

## 2014-06-18 ENCOUNTER — Encounter: Payer: Self-pay | Admitting: Family

## 2014-06-18 VITALS — BP 128/90 | HR 117 | Temp 98.2°F | Resp 20 | Ht 64.0 in | Wt 160.0 lb

## 2014-06-18 DIAGNOSIS — Z0000 Encounter for general adult medical examination without abnormal findings: Secondary | ICD-10-CM

## 2014-06-18 DIAGNOSIS — N644 Mastodynia: Secondary | ICD-10-CM

## 2014-06-18 DIAGNOSIS — M545 Low back pain, unspecified: Secondary | ICD-10-CM

## 2014-06-18 MED ORDER — OXYCODONE HCL 30 MG PO TABS: 30.0000 mg | ORAL_TABLET | ORAL | Status: DC | PRN

## 2014-06-18 NOTE — Assessment & Plan Note (Signed)
Remains stable. [facility] and awaiting pain clinic. Continue current oxycodone at this time. Follow up when results obtained.

## 2014-06-18 NOTE — Assessment & Plan Note (Addendum)
1) Anticipatory Guidance: Discussed importance of wearing a seatbelt while driving and not texting while driving; changing batteries in smoke detector at least once annually; wearing suntan lotion when outside; eating a balanced and moderate diet; getting physical activity at least 30 minutes per day.  2) Immunizations / Screenings / Labs:  All immunizations are up to date. Pt believes she had a PAP when removing her Mirena about 3 mos ago, however will check on results. Due for both dental and vision exams. All other screening are up to date. Labs not ordered at this time secondary to cost and lack of insurance.  Overall well exam. Encouraged increasing fruit and vegetable intake and increasing walking/physical activity to 30 minutes most days of the week. Follow up prevention exam in 1 year. Will attempt lab work at next visit or when insurance is obtained.

## 2014-06-18 NOTE — Patient Instructions (Signed)
Thank you for choosing [facility].  Summary/Instructions:  Health Maintenance Adopting a healthy lifestyle and getting preventive care can go a long way to promote health and wellness. Talk with your health care provider about what schedule of regular examinations is right for you. This is a good chance for you to check in with your provider about disease prevention and staying healthy. In between checkups, there are plenty of things you can do on your own. Experts have done a lot of research about which lifestyle changes and preventive measures are most likely to keep you healthy. Ask your health care provider for more information. WEIGHT AND DIET  Eat a healthy diet  Be sure to include plenty of vegetables, fruits, low-fat dairy products, and lean protein.  Do not eat a lot of foods high in solid fats, added sugars, or salt.  Get regular exercise. This is one of the most important things you can do for your health.  Most adults should exercise for at least 150 minutes each week. The exercise should increase your heart rate and make you sweat (moderate-intensity exercise).  Most adults should also do strengthening exercises at least twice a week. This is in addition to the moderate-intensity exercise.  Maintain a healthy weight  Body mass index (BMI) is a measurement that can be used to identify possible weight problems. It estimates body fat based on height and weight. Your health care provider can help determine your BMI and help you achieve or maintain a healthy weight.  For females 54 years of age and older:   A BMI below 18.5 is considered underweight.  A BMI of 18.5 to 24.9 is normal.  A BMI of 25 to 29.9 is considered overweight.  A BMI of 30 and above is considered obese.  Watch levels of cholesterol and blood lipids  You should start having your blood tested for lipids and cholesterol at 27 years of age, then have this test every 5 years.  You may need to have  your cholesterol levels checked more often if:  Your lipid or cholesterol levels are high.  You are older than 27 years of age.  You are at high risk for heart disease.  CANCER SCREENING   Lung Cancer  Lung cancer screening is recommended for adults 27-18 years old who are at high risk for lung cancer because of a history of smoking.  A yearly low-dose CT scan of the lungs is recommended for people who:  Currently smoke  Have quit within the past 15 years.  Have at least a 30-pack-year history of smoking. A pack year is smoking an average of one pack of cigarettes a day for 1 year.  Yearly screening should continue until it has been 15 years since you quit.  Yearly screening should stop if you develop a health problem that would prevent you from having lung cancer treatment.  Breast Cancer  Practice breast self-awareness. This means understanding how your breasts normally appear and feel.  It also means doing regular breast self-exams. Let your health care provider know about any changes, no matter how small.  If you are in your 20s or 27s, you should have a clinical breast exam (CBE) by a health care provider every 1-3 years as part of a regular health exam.  If you are 27 or older, have a CBE every year. Also consider having a breast X-ray (mammogram) every year.  If you have a family history of breast cancer, talk to your health  care provider about genetic screening.  If you are at high risk for breast cancer, talk to your health care provider about having an MRI and a mammogram every year.  Breast cancer gene (BRCA) assessment is recommended for women who have family members with BRCA-related cancers. BRCA-related cancers include:  Breast.  Ovarian.  Tubal.  Peritoneal cancers.  Results of the assessment will determine the need for genetic counseling and BRCA1 and BRCA2 testing. Cervical Cancer Routine pelvic examinations to screen for cervical cancer are no  longer recommended for nonpregnant women who are considered low risk for cancer of the pelvic organs (ovaries, uterus, and vagina) and who do not have symptoms. A pelvic examination may be necessary if you have symptoms including those associated with pelvic infections. Ask your health care provider if a screening pelvic exam is right for you.   The Pap test is the screening test for cervical cancer for women who are considered at risk.  If you had a hysterectomy for a problem that was not cancer or a condition that could lead to cancer, then you no longer need Pap tests.  If you are older than 65 years, and you have had normal Pap tests for the past 10 years, you no longer need to have Pap tests.  If you have had past treatment for cervical cancer or a condition that could lead to cancer, you need Pap tests and screening for cancer for at least 20 years after your treatment.  If you no longer get a Pap test, assess your risk factors if they change (such as having a new sexual partner). This can affect whether you should start being screened again.  Some women have medical problems that increase their chance of getting cervical cancer. If this is the case for you, your health care provider may recommend more frequent screening and Pap tests.  The human papillomavirus (HPV) test is another test that may be used for cervical cancer screening. The HPV test looks for the virus that can cause cell changes in the cervix. The cells collected during the Pap test can be tested for HPV.  The HPV test can be used to screen women 27 years of age and older. Getting tested for HPV can extend the interval between normal Pap tests from three to five years.  An HPV test also should be used to screen women of any age who have unclear Pap test results.  After 27 years of age, women should have HPV testing as often as Pap tests.  Colorectal Cancer  This type of cancer can be detected and often  prevented.  Routine colorectal cancer screening usually begins at 27 years of age and continues through 27 years of age.  Your health care provider may recommend screening at an earlier age if you have risk factors for colon cancer.  Your health care provider may also recommend using home test kits to check for hidden blood in the stool.  A small camera at the end of a tube can be used to examine your colon directly (sigmoidoscopy or colonoscopy). This is done to check for the earliest forms of colorectal cancer.  Routine screening usually begins at age 65.  Direct examination of the colon should be repeated every 5-10 years through 27 years of age. However, you may need to be screened more often if early forms of precancerous polyps or small growths are found. Skin Cancer  Check your skin from head to toe regularly.  Tell your health  care provider about any new moles or changes in moles, especially if there is a change in a mole's shape or color.  Also tell your health care provider if you have a mole that is larger than the size of a pencil eraser.  Always use sunscreen. Apply sunscreen liberally and repeatedly throughout the day.  Protect yourself by wearing long sleeves, pants, a wide-brimmed hat, and sunglasses whenever you are outside. HEART DISEASE, DIABETES, AND HIGH BLOOD PRESSURE   Have your blood pressure checked at least every 1-2 years. High blood pressure causes heart disease and increases the risk of stroke.  If you are between 78 years and 37 years old, ask your health care provider if you should take aspirin to prevent strokes.  Have regular diabetes screenings. This involves taking a blood sample to check your fasting blood sugar level.  If you are at a normal weight and have a low risk for diabetes, have this test once every three years after 27 years of age.  If you are overweight and have a high risk for diabetes, consider being tested at a younger age or more  often. PREVENTING INFECTION  Hepatitis B  If you have a higher risk for hepatitis B, you should be screened for this virus. You are considered at high risk for hepatitis B if:  You were born in a country where hepatitis B is common. Ask your health care provider which countries are considered high risk.  Your parents were born in a high-risk country, and you have not been immunized against hepatitis B (hepatitis B vaccine).  You have HIV or AIDS.  You use needles to inject street drugs.  You live with someone who has hepatitis B.  You have had sex with someone who has hepatitis B.  You get hemodialysis treatment.  You take certain medicines for conditions, including cancer, organ transplantation, and autoimmune conditions. Hepatitis C  Blood testing is recommended for:  Everyone born from [birth year] through 1964.  Anyone with known risk factors for hepatitis C. Sexually transmitted infections (STIs)  You should be screened for sexually transmitted infections (STIs) including gonorrhea and chlamydia if:  You are sexually active and are younger than 27 years of age.  You are older than 27 years of age and your health care provider tells you that you are at risk for this type of infection.  Your sexual activity has changed since you were last screened and you are at an increased risk for chlamydia or gonorrhea. Ask your health care provider if you are at risk.  If you do not have HIV, but are at risk, it may be recommended that you take a prescription medicine daily to prevent HIV infection. This is called pre-exposure prophylaxis (PrEP). You are considered at risk if:  You are sexually active and do not regularly use condoms or know the HIV status of your partner(s).  You take drugs by injection.  You are sexually active with a partner who has HIV. Talk with your health care provider about whether you are at high risk of being infected with HIV. If you choose to begin PrEP, you  should first be tested for HIV. You should then be tested every 3 months for as long as you are taking PrEP.  PREGNANCY   If you are premenopausal and you may become pregnant, ask your health care provider about preconception counseling.  If you may become pregnant, take 400 to 800 micrograms (mcg) of folic acid every day.  If you want to prevent pregnancy, talk to your health care provider about birth control (contraception). OSTEOPOROSIS AND MENOPAUSE   Osteoporosis is a disease in which the bones lose minerals and strength with aging. This can result in serious bone fractures. Your risk for osteoporosis can be identified using a bone density scan.  If you are 7 years of age or older, or if you are at risk for osteoporosis and fractures, ask your health care provider if you should be screened.  Ask your health care provider whether you should take a calcium or vitamin D supplement to lower your risk for osteoporosis.  Menopause may have certain physical symptoms and risks.  Hormone replacement therapy may reduce some of these symptoms and risks. Talk to your health care provider about whether hormone replacement therapy is right for you.  HOME CARE INSTRUCTIONS   Schedule regular health, dental, and eye exams.  Stay current with your immunizations.   Do not use any tobacco products including cigarettes, chewing tobacco, or electronic cigarettes.  If you are pregnant, do not drink alcohol.  If you are breastfeeding, limit how much and how often you drink alcohol.  Limit alcohol intake to no more than 1 drink per day for nonpregnant women. One drink equals 12 ounces of beer, 5 ounces of wine, or 1 ounces of hard liquor.  Do not use street drugs.  Do not share needles.  Ask your health care provider for help if you need support or information about quitting drugs.  Tell your health care provider if you often feel depressed.  Tell your health care provider if you have ever  been abused or do not feel safe at home. Document Released: 01/29/2011 Document Revised: 11/30/2013 Document Reviewed: 06/17/2013 [facility] Patient Information 2015 [place], [place]. This information is not intended to replace advice given to you by your health care provider. Make sure you discuss any questions you have with your health care provider.

## 2014-06-18 NOTE — Progress Notes (Signed)
Subjective:    Patient ID: Sandra [name], female    DOB: 04/11/1986, 28 y.o.   MRN: [id number]  Chief Complaint  Patient presents with  . CPE    vaginal symptoms are better    HPI:  Sandra [name] is a 28 y.o. female who presents today for an annual wellness visit.  1) Health Maintenance -   Diet - Hasn't been as good; cut out red meat; eats a lot of eggs. Exercise - Not doing any structured exercise - is working to get back on structured.  Wt Readings from Last 3 Encounters:  06/18/14 160 lb (72.576 kg)  06/10/14 157 lb 12.8 oz (71.578 kg)  05/19/14 157 lb 1.9 oz (71.269 kg)   2) Preventative Exams / Immunizations:  Dental -- Due for exam Vision -- Due for exam  Health Maintenance  Topic Date Due  . PAP SMEAR  07/14/2005  . INFLUENZA VACCINE  02/28/2015  . TETANUS/TDAP  04/29/2022   Due for PAP - will schedule.   Allergies  Allergen Reactions  . Latex    Current Outpatient Prescriptions on File Prior to Visit  Medication Sig Dispense Refill  . ALPRAZolam (XANAX) 1 MG tablet Take 1 tablet (1 mg total) by mouth at bedtime as needed for anxiety. 30 tablet 0  . metroNIDAZOLE (FLAGYL) 500 MG tablet Take 1 tablet (500 mg total) by mouth 2 (two) times daily. 21 tablet 0   No current facility-administered medications on file prior to visit.   Past Surgical History  Procedure Laterality Date  . Dilation and curettage of uterus      August 2011   Past Medical History  Diagnosis Date  . Blood transfusion without reported diagnosis   . GERD (gastroesophageal reflux disease)     Occasional   Family History  Problem Relation Age of Onset  . Heart disease Mother   . Arthritis Maternal Grandmother   . Hypertension Maternal Grandmother   . Ovarian cancer Maternal Grandmother   . Lung cancer Maternal Grandmother    History   Social History  . Marital Status: Married    Spouse Name: N/A    Number of Children: 1  . Years of Education: 15    Occupational History  . Server    Social History Main Topics  . Smoking status: Current Every Day Smoker -- 0.25 packs/day for 10 years    Types: Cigarettes  . Smokeless tobacco: Never Used  . Alcohol Use: No  . Drug Use: Not on file  . Sexual Activity:    Partners: Male    Pharmacist, hospitalBirth Control/ Protection: None   Other Topics Concern  . Not on file   Social History Narrative   [place], [place] Lives in an apartment with husband and daughter. One cat as pet. 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   Denies any religious beliefs that would effect healthcare.     Review of Systems  Constitutional: Denies fever, chills, fatigue, or significant weight gain/loss. HENT: Head: Denies headache or neck pain Ears: Denies changes in hearing, ringing in ears, earache, drainage Nose: Denies discharge, stuffiness, itching, nosebleed, sinus pain Throat: Denies sore throat, hoarseness, dry mouth, sores, thrush Eyes: Denies loss/changes in vision, pain, redness, blurry/double vision, flashing lights Cardiovascular: Denies chest pain/discomfort, tightness, palpitations, shortness of breath with activity, difficulty lying down, swelling, sudden awakening with shortness of breath Breast: Denies dimpling or asymmetry. Describes soreness and increased breast size. States  she has not stopped lactating since having her 27 year old.  Respiratory: Denies shortness of breath, cough, sputum production, wheezing Gastrointestinal: Denies dysphasia, heartburn, change in appetite, nausea, change in bowel habits, rectal bleeding, constipation, diarrhea, yellow skin or eyes Genitourinary: Denies frequency, urgency, burning/pain, blood in urine, incontinence, change in urinary strength. Musculoskeletal: Denies muscle/joint pain, stiffness, back pain, redness or swelling of joints, trauma Skin: Denies rashes, lumps, itching, dryness, color changes, or hair/nail  changes Neurological: Denies dizziness, fainting, seizures, weakness, numbness, tingling, tremor Psychiatric - Denies nervousness, stress, depression or memory loss Endocrine: Denies heat or cold intolerance, sweating, frequent urination, excessive thirst, changes in appetite Hematologic: Denies ease of bruising or bleeding    Objective:    BP 128/90 mmHg  Pulse 117  Temp(Src) 98.2 F (36.8 C) (Oral)  Resp 20  Ht 5\' 4"  (1.626 m)  Wt 160 lb (72.576 kg)  BMI 27.45 kg/m2  SpO2 97%  LMP 05/05/2014 Nursing note and vital signs reviewed.  Physical Exam  Constitutional: She is oriented to person, place, and time. She appears well-developed and well-nourished.  HENT:  Head: Normocephalic.  Right Ear: Hearing, tympanic membrane, external ear and ear canal normal.  Left Ear: Hearing, tympanic membrane, external ear and ear canal normal.  Nose: Nose normal.  Mouth/Throat: Uvula is midline, oropharynx is clear and moist and mucous membranes are normal.  Eyes: Conjunctivae and EOM are normal. Pupils are equal, round, and reactive to light.  Neck: Neck supple. No JVD present. No tracheal deviation present. No thyromegaly present.  Cardiovascular: Normal rate, regular rhythm, normal heart sounds and intact distal pulses.   Pulmonary/Chest: Effort normal and breath sounds normal. Right breast exhibits tenderness. Right breast exhibits no inverted nipple, no mass, no nipple discharge and no skin change. Left breast exhibits tenderness. Left breast exhibits no inverted nipple, no mass, no nipple discharge and no skin change. Breasts are symmetrical.  Abdominal: Soft. Bowel sounds are normal. She exhibits no distension and no mass. There is no tenderness. There is no rebound and no guarding.  Musculoskeletal: Normal range of motion. She exhibits no edema or tenderness.  Lymphadenopathy:    She has no cervical adenopathy.  Neurological: She is alert and oriented to person, place, and time. She has  normal reflexes. No cranial nerve deficit. She exhibits normal muscle tone. Coordination normal.  Skin: Skin is warm and dry.  Psychiatric: She has a normal mood and affect. Her behavior is normal. Judgment and thought content normal.       Assessment & Plan:

## 2014-06-18 NOTE — Assessment & Plan Note (Signed)
Pt concerned for continued lactation. Discussed using ice packs and cabbage leaves to assist. If continues will evaluate prolactin levels and consider GYN referral.

## 2014-06-18 NOTE — Progress Notes (Signed)
Pre visit review using our clinic review tool, if applicable. No additional management support is needed unless otherwise documented below in the visit note. 

## 2014-07-15 ENCOUNTER — Telehealth: Payer: Self-pay | Admitting: Family

## 2014-07-15 MED ORDER — OXYCODONE HCL 30 MG PO TABS: 30.0000 mg | ORAL_TABLET | ORAL | Status: DC | PRN

## 2014-07-15 NOTE — Telephone Encounter (Signed)
Pt requesting a refill of pain medication, she wants to pick up tomorrow   [phone]

## 2014-07-15 NOTE — Telephone Encounter (Signed)
Medication refilled

## 2014-07-16 NOTE — Telephone Encounter (Signed)
Pt aware.

## 2014-08-02 ENCOUNTER — Telehealth: Payer: Self-pay | Admitting: Family

## 2014-08-02 NOTE — Telephone Encounter (Signed)
Pt called you back, said she rec a coupld of calls.   Best number [phone]

## 2014-08-03 NOTE — Telephone Encounter (Signed)
There were no calls made to this pt by me.

## 2014-08-06 ENCOUNTER — Telehealth: Payer: Self-pay | Admitting: Family

## 2014-08-16 ENCOUNTER — Other Ambulatory Visit: Payer: Self-pay

## 2014-08-16 ENCOUNTER — Telehealth: Payer: Self-pay | Admitting: Family

## 2014-08-16 ENCOUNTER — Other Ambulatory Visit: Payer: Self-pay | Admitting: Family

## 2014-08-16 MED ORDER — OXYCODONE HCL 30 MG PO TABS: 30.0000 mg | ORAL_TABLET | ORAL | Status: DC | PRN

## 2014-08-16 MED ORDER — ALPRAZOLAM 1 MG PO TABS: 1.0000 mg | ORAL_TABLET | Freq: Every evening | ORAL | Status: DC | PRN

## 2014-08-16 NOTE — Telephone Encounter (Signed)
Patient requesting a refill of oxycodone (ROXICODONE) 30 MG immediate release

## 2014-08-16 NOTE — Telephone Encounter (Signed)
Ok to refill but needs a urine drug screen.

## 2014-08-16 NOTE — Telephone Encounter (Signed)
Called pt to inform her that her oxycodone was ready for pick up. Pt needs urine drug screen when picking up medication.

## 2014-09-02 ENCOUNTER — Telehealth: Payer: Self-pay | Admitting: Family

## 2014-09-02 NOTE — Telephone Encounter (Signed)
Tried calling pt. Left message for her to call back

## 2014-09-02 NOTE — Telephone Encounter (Signed)
Please inform the patient that her urine drug screen was positive for phenobarbital which is an illicit controlled substance that is not prescribed. In accordance with the Controlled Substance Agreement, we will not be able to prescribe any additional narcotics or controlled medications, however we are more than happy to take care of her other primary care needs.

## 2014-09-15 ENCOUNTER — Telehealth: Payer: Self-pay | Admitting: Family

## 2014-09-15 MED ORDER — ALPRAZOLAM 1 MG PO TABS: 1.0000 mg | ORAL_TABLET | Freq: Every evening | ORAL | Status: DC | PRN

## 2014-09-15 MED ORDER — OXYCODONE HCL 30 MG PO TABS: 30.0000 mg | ORAL_TABLET | ORAL | Status: DC | PRN

## 2014-09-15 NOTE — Telephone Encounter (Signed)
Pts husband called asking what was going on with the medication bc pt is very upset. I explained to him that we will give her a refill but she needs to let us know if she goes to the hospital. Pt is aware that rx is ready for pick up. Also she is aware that another appointment needs to be made before next refill.

## 2014-09-15 NOTE — Telephone Encounter (Signed)
Please inform the patient that we will refill the prescriptions for her. In the future, she will need to notify us of these events. Medications printed and able to be picked up.

## 2014-09-15 NOTE — Telephone Encounter (Signed)
Sp[facility] with [facility]. Was only able to get that the pt was admitted on 08/10/2014

## 2014-09-15 NOTE — Telephone Encounter (Signed)
Please inform the patient that her urine drug screen was positive for phenobarbital which was not a prescribed medication and is considered an illicit medication. Therefore in accordance with our controlled substance policy we are more than happy to see her for her primary care needs, however cannot provide an controlled substances.

## 2014-09-15 NOTE — Telephone Encounter (Signed)
[facility] admission for alcohol withdrawl. Pt states that she was given phenobarbital during admission. Pt also states that admission lasted for 3-4 days.

## 2014-09-15 NOTE — Telephone Encounter (Signed)
Pt request refill for oxycodone and xanax. Please call pt

## 2014-09-20 ENCOUNTER — Other Ambulatory Visit: Payer: Self-pay | Admitting: Family

## 2014-09-20 DIAGNOSIS — M549 Dorsalgia, unspecified: Secondary | ICD-10-CM

## 2014-10-15 ENCOUNTER — Other Ambulatory Visit: Payer: Self-pay

## 2014-10-15 ENCOUNTER — Ambulatory Visit (INDEPENDENT_AMBULATORY_CARE_PROVIDER_SITE_OTHER): Payer: Self-pay | Admitting: Family

## 2014-10-15 ENCOUNTER — Encounter: Payer: Self-pay | Admitting: Family

## 2014-10-15 ENCOUNTER — Ambulatory Visit: Payer: Self-pay | Admitting: Family

## 2014-10-15 VITALS — BP 150/100 | HR 128 | Temp 98.3°F | Resp 18 | Wt 159.8 lb

## 2014-10-15 DIAGNOSIS — R300 Dysuria: Secondary | ICD-10-CM | POA: Insufficient documentation

## 2014-10-15 LAB — POCT URINALYSIS DIPSTICK
BILIRUBIN UA: NEGATIVE
Blood, UA: NEGATIVE
GLUCOSE UA: NEGATIVE
KETONES UA: NEGATIVE
Leukocytes, UA: NEGATIVE
Nitrite, UA: NEGATIVE
Protein, UA: POSITIVE
Urobilinogen, UA: >=8
pH, UA: 5

## 2014-10-15 MED ORDER — OXYCODONE HCL 30 MG PO TABS: 30.0000 mg | ORAL_TABLET | ORAL | Status: DC | PRN

## 2014-10-15 MED ORDER — ALPRAZOLAM 1 MG PO TABS: 1.0000 mg | ORAL_TABLET | Freq: Every evening | ORAL | Status: DC | PRN

## 2014-10-15 MED ORDER — CIPROFLOXACIN HCL 250 MG PO TABS: 250.0000 mg | ORAL_TABLET | Freq: Two times a day (BID) | ORAL | Status: DC

## 2014-10-15 NOTE — Progress Notes (Signed)
Pre visit review using our clinic review tool, if applicable. No additional management support is needed unless otherwise documented below in the visit note. 

## 2014-10-15 NOTE — Progress Notes (Signed)
   Subjective:    Patient ID: Sandra [name], female    DOB: 11/01/1985, 29 y.o.   MRN: [id number]  No chief complaint on file.   HPI:  Sandra [name] is a 29 y.o. female who presents today for follow up.  1) Urinary frequency - Has been going on for 3 days with increased frequency and occasional burning. Denies fever, chills and occasional back pain.  2) Back pain - Indicates that the pain is doing okay. Currently maintained on xanax and oxycodone. Indicates that she has had some difficulty sleeping at night. Currently not working as often, possibly every couple of weeks secondary to her back pain.    Allergies  Allergen Reactions  . Latex     Current Outpatient Prescriptions on File Prior to Visit  Medication Sig Dispense Refill  . ALPRAZolam (XANAX) 1 MG tablet Take 1 tablet (1 mg total) by mouth at bedtime as needed for anxiety. 30 tablet 0  . metroNIDAZOLE (FLAGYL) 500 MG tablet Take 1 tablet (500 mg total) by mouth 2 (two) times daily. 21 tablet 0  . oxycodone (ROXICODONE) 30 MG immediate release tablet Take 1 tablet (30 mg total) by mouth every 4 (four) hours as needed for pain. 120 tablet 0   No current facility-administered medications on file prior to visit.    Review of Systems  Genitourinary: Positive for dysuria, urgency and frequency.  Musculoskeletal: Positive for back pain.      Objective:      Nursing note and vital signs reviewed.  Physical Exam  Constitutional: She is oriented to person, place, and time. She appears well-developed and well-nourished. No distress.  Cardiovascular: Normal rate, regular rhythm, normal heart sounds and intact distal pulses.   Pulmonary/Chest: Effort normal and breath sounds normal.  Abdominal: There is CVA tenderness.  Neurological: She is alert and oriented to person, place, and time.  Skin: Skin is warm and dry.  Psychiatric: She has a normal mood and affect. Her behavior is normal. Judgment and thought  content normal.       Assessment & Plan:

## 2014-10-15 NOTE — Assessment & Plan Note (Signed)
Symptoms and exam consistent with cystitis. In office UA shows no evidence of leukocytes, nitrites, or hematuria. Urine sent for culture. Start Cipro. Patient instructed to drink plenty of fluids. Follow-up if symptoms worsen or fail to improve.

## 2014-10-15 NOTE — Patient Instructions (Signed)
Thank you for choosing [facility].  Summary/Instructions:  If your symptoms worsen or fail to improve, please contact our office for further instruction, or in case of emergency go directly to the emergency room at the closest medical facility.    Urinary Tract Infection Urinary tract infections (UTIs) can develop anywhere along your urinary tract. Your urinary tract is your body's drainage system for removing wastes and extra water. Your urinary tract includes two kidneys, two ureters, a bladder, and a urethra. Your kidneys are a pair of bean-shaped organs. Each kidney is about the size of your fist. They are located below your ribs, one on each side of your spine. CAUSES Infections are caused by microbes, which are microscopic organisms, including fungi, viruses, and bacteria. These organisms are so small that they can only be seen through a microscope. Bacteria are the microbes that most commonly cause UTIs. SYMPTOMS  Symptoms of UTIs may vary by age and gender of the patient and by the location of the infection. Symptoms in young women typically include a frequent and intense urge to urinate and a painful, burning feeling in the bladder or urethra during urination. Older women and men are more likely to be tired, shaky, and weak and have muscle aches and abdominal pain. A fever may mean the infection is in your kidneys. Other symptoms of a kidney infection include pain in your back or sides below the ribs, nausea, and vomiting. DIAGNOSIS To diagnose a UTI, your caregiver will ask you about your symptoms. Your caregiver also will ask to provide a urine sample. The urine sample will be tested for bacteria and white blood cells. White blood cells are made by your body to help fight infection. TREATMENT  Typically, UTIs can be treated with medication. Because most UTIs are caused by a bacterial infection, they usually can be treated with the use of antibiotics. The choice of antibiotic and  length of treatment depend on your symptoms and the type of bacteria causing your infection. HOME CARE INSTRUCTIONS  If you were prescribed antibiotics, take them exactly as your caregiver instructs you. Finish the medication even if you feel better after you have only taken some of the medication.  Drink enough water and fluids to keep your urine clear or pale yellow.  Avoid caffeine, tea, and carbonated beverages. They tend to irritate your bladder.  Empty your bladder often. Avoid holding urine for long periods of time.  Empty your bladder before and after sexual intercourse.  After a bowel movement, women should cleanse from front to back. Use each tissue only once. SEEK MEDICAL CARE IF:   You have back pain.  You develop a fever.  Your symptoms do not begin to resolve within 3 days. SEEK IMMEDIATE MEDICAL CARE IF:   You have severe back pain or lower abdominal pain.  You develop chills.  You have nausea or vomiting.  You have continued burning or discomfort with urination. MAKE SURE YOU:   Understand these instructions.  Will watch your condition.  Will get help right away if you are not doing well or get worse. Document Released: 04/25/2005 Document Revised: 01/15/2012 Document Reviewed: 08/24/2011 [facility] Patient Information 2015 [place], [place]. This information is not intended to replace advice given to you by your health care provider. Make sure you discuss any questions you have with your health care provider.

## 2014-10-17 LAB — URINE CULTURE

## 2014-10-18 ENCOUNTER — Encounter: Payer: Self-pay | Admitting: Family

## 2014-11-11 ENCOUNTER — Telehealth: Payer: Self-pay | Admitting: Family

## 2014-11-11 ENCOUNTER — Other Ambulatory Visit: Payer: Self-pay

## 2014-11-11 NOTE — Telephone Encounter (Signed)
Pt request refill for oxycodone and xanax. Please call pt

## 2014-11-12 ENCOUNTER — Telehealth: Payer: Self-pay

## 2014-11-12 MED ORDER — OXYCODONE HCL 30 MG PO TABS: 30.0000 mg | ORAL_TABLET | Freq: Four times a day (QID) | ORAL | Status: DC | PRN

## 2014-11-12 MED ORDER — ALPRAZOLAM 1 MG PO TABS: 1.0000 mg | ORAL_TABLET | Freq: Every evening | ORAL | Status: DC | PRN

## 2014-11-12 NOTE — Telephone Encounter (Signed)
Pt aware that rx is ready for pick up for oxycodone and xanax

## 2014-11-22 ENCOUNTER — Telehealth: Payer: Self-pay

## 2014-11-22 NOTE — Telephone Encounter (Signed)
LVM for pt to call back.

## 2014-12-17 ENCOUNTER — Telehealth: Payer: Self-pay | Admitting: Family

## 2014-12-17 NOTE — Telephone Encounter (Signed)
oxycodone (ROXICODONE) 30 MG immediate release tablet [[id number]][[id number]]      ALPRAZolam (XANAX) 1 MG tablet [[id number]][[id number]]   Patient is requesting refill

## 2014-12-17 NOTE — Telephone Encounter (Signed)
Called pt back in regards to her UDS she states that she has no idea why morphine would be in her system. I told her if she would like to come and talk to you she is more than welcome to make an appointment. She requested to make an appointment.

## 2014-12-19 NOTE — Telephone Encounter (Signed)
Noted - patient has appointment on Tuesday.

## 2014-12-21 ENCOUNTER — Ambulatory Visit (INDEPENDENT_AMBULATORY_CARE_PROVIDER_SITE_OTHER): Payer: Self-pay | Admitting: Family

## 2014-12-21 ENCOUNTER — Encounter: Payer: Self-pay | Admitting: Family

## 2014-12-21 VITALS — BP 142/102 | HR 123 | Temp 98.6°F | Resp 18 | Ht 64.0 in | Wt 156.0 lb

## 2014-12-21 DIAGNOSIS — M545 Low back pain, unspecified: Secondary | ICD-10-CM

## 2014-12-21 MED ORDER — OXYCODONE HCL 20 MG PO TABS: 20.0000 mg | ORAL_TABLET | Freq: Four times a day (QID) | ORAL | Status: DC | PRN

## 2014-12-21 MED ORDER — ALPRAZOLAM 1 MG PO TABS: 1.0000 mg | ORAL_TABLET | Freq: Every evening | ORAL | Status: DC | PRN

## 2014-12-21 NOTE — Assessment & Plan Note (Signed)
Continues to experience low back pain and managed with oxycodone. Discussed continued use of pain medication and potential need for referral to pain clinic. Continue to await records from [facility]. Decrease Oxycodone to 20 mg 4 times daily PRN. Continue Xanax 1 mg as needed for sleep. 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 Discussed that if she does not show for orthopedics appointment or if there is anything in her next drug test, then the she will no longer receive prescriptions for controlled substances as the goal is to improve her quality of life. Follow up in 1 month.

## 2014-12-21 NOTE — Progress Notes (Signed)
Pre visit review using our clinic review tool, if applicable. No additional management support is needed unless otherwise documented below in the visit note. 
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
## 2014-12-21 NOTE — Patient Instructions (Addendum)
Thank you for choosing [facility].  Summary/Instructions:  Your prescription(s) have been submitted to your pharmacy or been printed and provided for you. Please take as directed and contact our office if you believe you are having problem(s) with the medication(s) or have any questions.  A referral to orthopedics has been placed.   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  Work on obtaining records.   No other medications besides what is prescribed unless prescribed by the hospital.    Opioid Withdrawal Opioids are a group of narcotic drugs. They include the street drug heroin. They also include pain medicines, such as morphine, hydrocodone, oxycodone, and fentanyl. Opioid withdrawal is a group of characteristic physical and mental signs and symptoms. It typically occurs if you have been using opioids daily for several weeks or longer and stop using or rapidly decrease use. Opioid withdrawal can also occur if you have used opioids daily for a long time and are given a medicine to block the effect.  SIGNS AND SYMPTOMS Opioid withdrawal includes three or more of the following symptoms:   Depressed, anxious, or irritable mood.  Nausea or vomiting.  Muscle aches or spasms.   Watery eyes.   Runny nose.  Dilated pupils, sweating, or hairs standing on end.  Diarrhea or intestinal cramping.  Yawning.   Fever.  Increased blood pressure.  Fast pulse.  Restlessness or trouble sleeping. These signs and symptoms occur within several hours of stopping or reducing short-acting opioids, such as heroin. They can occur within 3 days of stopping or reducing long-acting opioids, such as methadone. Withdrawal begins within minutes of receiving a drug that blocks the effects of opioids, such as naltrexone or naloxone. DIAGNOSIS  Opioid use disorder is diagnosed by your health care provider. You will be asked about your symptoms, drug and alcohol use, medical history, and use of medicines. A  physical exam may be done. Lab tests may be ordered. Your health care provider may have you see a mental health professional.  TREATMENT  The treatment for opioid withdrawal is usually provided by medical doctors with special training in substance use disorders (addiction specialists). The following medicines may be included in treatment:  Opioids given in place of the abused opioid. They turn on opioid receptors in the brain and lessen or prevent withdrawal symptoms. They are gradually decreased (opioid substitution and taper).  Non-opioids that can lessen certain opioid withdrawal symptoms. They may be used alone or with opioid substitution and taper. Successful long-term recovery usually requires medicine, counseling, and group support. HOME CARE INSTRUCTIONS   Take medicines only as directed by your health care provider.  Check with your health care provider before starting new medicines.  Keep all follow-up visits as directed by your health care provider. SEEK MEDICAL CARE IF:  You are not able to take your medicines as directed.  Your symptoms get worse.  You relapse. SEEK IMMEDIATE MEDICAL CARE IF:  You have serious thoughts about hurting yourself or others.  You have a seizure.  You lose consciousness. Document Released: 07/19/2003 Document Revised: 11/30/2013 Document Reviewed: 07/29/2013 [facility] Patient Information 2015 [place], [place]. This information is not intended to replace advice given to you by your health care provider. Make sure you discuss any questions you have with your health care provider.

## 2015-01-17 ENCOUNTER — Inpatient Hospital Stay: Payer: Self-pay | Admitting: Family

## 2015-01-18 ENCOUNTER — Telehealth: Payer: Self-pay | Admitting: Family

## 2015-01-18 NOTE — Telephone Encounter (Signed)
Noted  

## 2015-01-18 NOTE — Telephone Encounter (Signed)
Patient no showed for hospital FU on 6/20.  Please advise.

## 2015-01-18 NOTE — Telephone Encounter (Signed)
May reschedule if she calls back.  

## 2015-03-26 ENCOUNTER — Emergency Department (HOSPITAL_BASED_OUTPATIENT_CLINIC_OR_DEPARTMENT_OTHER)
Admission: EM | Admit: 2015-03-26 | Discharge: 2015-03-26 | Disposition: A | Discharge status: Home / Self Care | Payer: Self-pay | Attending: Emergency Medicine | Admitting: Emergency Medicine

## 2015-03-26 ENCOUNTER — Encounter (HOSPITAL_BASED_OUTPATIENT_CLINIC_OR_DEPARTMENT_OTHER): Payer: Self-pay | Admitting: Emergency Medicine

## 2015-03-26 DIAGNOSIS — I252 Old myocardial infarction: Secondary | ICD-10-CM | POA: Insufficient documentation

## 2015-03-26 DIAGNOSIS — Z91040 Latex allergy status: Secondary | ICD-10-CM | POA: Insufficient documentation

## 2015-03-26 DIAGNOSIS — Z8679 Personal history of other diseases of the circulatory system: Secondary | ICD-10-CM | POA: Insufficient documentation

## 2015-03-26 DIAGNOSIS — J020 Streptococcal pharyngitis: Secondary | ICD-10-CM | POA: Insufficient documentation

## 2015-03-26 DIAGNOSIS — Z8719 Personal history of other diseases of the digestive system: Secondary | ICD-10-CM | POA: Insufficient documentation

## 2015-03-26 DIAGNOSIS — Z720 Tobacco use: Secondary | ICD-10-CM | POA: Insufficient documentation

## 2015-03-26 LAB — CBC WITH DIFFERENTIAL/PLATELET
BASOS ABS: 0 10*3/uL (ref 0.0–0.1)
BASOS PCT: 0 % (ref 0–1)
EOS ABS: 0.1 10*3/uL (ref 0.0–0.7)
Eosinophils Relative: 1 % (ref 0–5)
HCT: 40.3 % (ref 36.0–46.0)
Hemoglobin: 13.8 g/dL (ref 12.0–15.0)
Lymphocytes Relative: 31 % (ref 12–46)
Lymphs Abs: 2.5 10*3/uL (ref 0.7–4.0)
MCH: 32.3 pg (ref 26.0–34.0)
MCHC: 34.2 g/dL (ref 30.0–36.0)
MCV: 94.4 fL (ref 78.0–100.0)
MONO ABS: 1.1 10*3/uL — AB (ref 0.1–1.0)
Monocytes Relative: 14 % — ABNORMAL HIGH (ref 3–12)
NEUTROS ABS: 4.3 10*3/uL (ref 1.7–7.7)
NEUTROS PCT: 54 % (ref 43–77)
PLATELETS: 213 10*3/uL (ref 150–400)
RBC: 4.27 MIL/uL (ref 3.87–5.11)
RDW: 12.9 % (ref 11.5–15.5)
WBC: 8 10*3/uL (ref 4.0–10.5)

## 2015-03-26 LAB — RAPID STREP SCREEN (MED CTR MEBANE ONLY): STREPTOCOCCUS, GROUP A SCREEN (DIRECT): POSITIVE — AB

## 2015-03-26 LAB — MONONUCLEOSIS SCREEN: MONO SCREEN: NEGATIVE

## 2015-03-26 MED ORDER — NAPROXEN 250 MG PO TABS
500.0000 mg | ORAL_TABLET | Freq: Once | ORAL | Status: AC
  Administered 2015-03-26: 500 mg via ORAL
  Filled 2015-03-26: qty 2

## 2015-03-26 MED ORDER — PENICILLIN G BENZATHINE 1200000 UNIT/2ML IM SUSP
1.2000 10*6.[IU] | Freq: Once | INTRAMUSCULAR | Status: AC
  Administered 2015-03-26: 1.2 10*6.[IU] via INTRAMUSCULAR
  Filled 2015-03-26: qty 2

## 2015-03-26 NOTE — ED Notes (Signed)
Pt verbalizes understanding of d/c instructions and denies any further needs at this time. 

## 2015-03-26 NOTE — ED Provider Notes (Signed)
CSN: [id number]     Arrival date & time 03/26/15  0421 History   First MD Initiated Contact with Patient 03/26/15 0434     Chief Complaint  Patient presents with  . Neck Mass      (Consider location/radiation/quality/duration/timing/severity/associated sxs/prior Treatment) HPI  This is a 29 year old female with a two-day history of sore throat and painful swelling in her left anterior neck. Pain is moderate and worse with palpation or movement of her neck. She denies difficulty breathing or swallowing. It does hurt to swallow. She is not aware of having a fever although she was noted to have a temperature of 99.5 on arrival. Her significant other observed a white patch on the back of her throat earlier. She is not currently having cold symptoms such as rhinorrhea or cough. She denies abdominal pain, vomiting or diarrhea.  Past Medical History  Diagnosis Date  . Blood transfusion without reported diagnosis   . GERD (gastroesophageal reflux disease)     Occasional  . H/O ETOH abuse   . Cardiomyopathy   . Opiate abuse, episodic   . MI (myocardial infarction)    Past Surgical History  Procedure Laterality Date  . Dilation and curettage of uterus      August 2011  . Dilation and curettage of uterus     Family History  Problem Relation Age of Onset  . Family history unknown: Yes  . Heart disease Mother   . Arthritis Maternal Grandmother   . Hypertension Maternal Grandmother   . Ovarian cancer Maternal Grandmother   . Lung cancer Maternal Grandmother    Social History  Substance Use Topics  . Smoking status: Current Every Day Smoker -- 1.00 packs/day for 10 years    Types: Cigarettes  . Smokeless tobacco: Never Used  . Alcohol Use: No     Comment: etoh abuse (recovering since 10/02/2012)   OB History    Gravida Para Term Preterm AB TAB SAB Ectopic Multiple Living   0 0 0 0 0 0 0 0       Review of Systems  All other systems reviewed and are negative.   Allergies   Latex  Home Medications   Prior to Admission medications   Medication Sig Start Date End Date Taking? Authorizing Provider  ibuprofen (ADVIL,MOTRIN) 200 MG tablet Take 400 mg by mouth every 6 (six) hours as needed (pain).    Historical Provider, MD   BP 140/91 mmHg  Pulse 111  Temp(Src) 99.2 F (37.3 C) (Oral)  Resp 16  Ht  (1.6 m)  Wt 150 lb (68.04 kg)  BMI 26.58 kg/m2  SpO2 100%   Physical Exam  General: Well-developed, well-nourished female in no acute distress; appearance consistent with age of record HENT: normocephalic; atraumatic; pharyngeal erythema without edema or exudate; no dysphonia; no stridor; no trismus; uvula midline Eyes: pupils equal, round and reactive to light; extraocular muscles intact Neck: supple; tender, enlarged left jugulodigastric lymph node without fluctuance; no posterior cervical lymphadenopathy Heart: regular rate and rhythm Lungs: clear to auscultation bilaterally Abdomen: soft; nondistended; nontender; no masses or hepatosplenomegaly; bowel sounds present Extremities: No deformity; full range of motion; pulses normal Neurologic: Awake, alert and oriented; motor function intact in all extremities and symmetric; no facial droop Skin: Warm and dry Psychiatric: Normal mood and affect    ED Course  Procedures (including critical care time)   MDM  Nursing notes and vitals signs, including pulse oximetry, reviewed.  Summary of this visit's results, reviewed by  myself:  Labs:  Results for orders placed or performed during the hospital encounter of 03/26/15 (from the past 24 hour(s))  Rapid strep screen     Status: Abnormal   Collection Time: 03/26/15  4:40 AM  Result Value Ref Range   Streptococcus, Group A Screen (Direct) POSITIVE (A) NEGATIVE  Mononucleosis screen     Status: None   Collection Time: 03/26/15  4:50 AM  Result Value Ref Range   Mono Screen NEGATIVE NEGATIVE     Paula Libra, MD 03/26/15 [phone]

## 2015-03-26 NOTE — ED Notes (Signed)
Patient reports that she has pain in her throat and to the outside of her throat near her chin down to her neck. Denies any SOB or trouble swallowing, reports pain with swallowing . This has been transpiring for about 2 -3 days

## 2015-03-30 ENCOUNTER — Encounter (HOSPITAL_BASED_OUTPATIENT_CLINIC_OR_DEPARTMENT_OTHER): Payer: Self-pay | Admitting: Emergency Medicine

## 2015-03-30 ENCOUNTER — Emergency Department (HOSPITAL_BASED_OUTPATIENT_CLINIC_OR_DEPARTMENT_OTHER)
Admission: EM | Admit: 2015-03-30 | Discharge: 2015-03-30 | Disposition: A | Discharge status: Home / Self Care | Payer: Self-pay | Attending: Emergency Medicine | Admitting: Emergency Medicine

## 2015-03-30 DIAGNOSIS — Z720 Tobacco use: Secondary | ICD-10-CM | POA: Insufficient documentation

## 2015-03-30 DIAGNOSIS — Z8719 Personal history of other diseases of the digestive system: Secondary | ICD-10-CM | POA: Insufficient documentation

## 2015-03-30 DIAGNOSIS — M7981 Nontraumatic hematoma of soft tissue: Secondary | ICD-10-CM | POA: Insufficient documentation

## 2015-03-30 DIAGNOSIS — I252 Old myocardial infarction: Secondary | ICD-10-CM | POA: Insufficient documentation

## 2015-03-30 DIAGNOSIS — T148XXA Other injury of unspecified body region, initial encounter: Secondary | ICD-10-CM

## 2015-03-30 DIAGNOSIS — Z91040 Latex allergy status: Secondary | ICD-10-CM | POA: Insufficient documentation

## 2015-03-30 MED ORDER — DIPHENHYDRAMINE HCL 25 MG PO CAPS
25.0000 mg | ORAL_CAPSULE | Freq: Four times a day (QID) | ORAL | Status: DC | PRN
  Administered 2015-03-30: 25 mg via ORAL
  Filled 2015-03-30: qty 1

## 2015-03-30 NOTE — Discharge Instructions (Signed)
Cryotherapy °Cryotherapy means treatment with cold. Ice or gel packs can be used to reduce both pain and swelling. Ice is the most helpful within the first 24 to 48 hours after an injury or flare-up from overusing a muscle or joint. Sprains, strains, spasms, burning pain, shooting pain, and aches can all be eased with ice. Ice can also be used when recovering from surgery. Ice is effective, has very few side effects, and is safe for most people to use. °PRECAUTIONS  °Ice is not a safe treatment option for people with: °· Raynaud phenomenon. This is a condition affecting small blood vessels in the extremities. Exposure to cold may cause your problems to return. °· Cold hypersensitivity. There are many forms of cold hypersensitivity, including: °¨ Cold urticaria. Red, itchy hives appear on the skin when the tissues begin to warm after being iced. °¨ Cold erythema. This is a red, itchy rash caused by exposure to cold. °¨ Cold hemoglobinuria. Red blood cells break down when the tissues begin to warm after being iced. The hemoglobin that carry oxygen are passed into the urine because they cannot combine with blood proteins fast enough. °· Numbness or altered sensitivity in the area being iced. °If you have any of the following conditions, do not use ice until you have discussed cryotherapy with your caregiver: °· Heart conditions, such as arrhythmia, angina, or chronic heart disease. °· High blood pressure. °· Healing wounds or open skin in the area being iced. °· Current infections. °· Rheumatoid arthritis. °· Poor circulation. °· Diabetes. °Ice slows the blood flow in the region it is applied. This is beneficial when trying to stop inflamed tissues from spreading irritating chemicals to surrounding tissues. However, if you expose your skin to cold temperatures for too long or without the proper protection, you can damage your skin or nerves. Watch for signs of skin damage due to cold. °HOME CARE INSTRUCTIONS °Follow  these tips to use ice and cold packs safely. °· Place a dry or damp towel between the ice and skin. A damp towel will cool the skin more quickly, so you may need to shorten the time that the ice is used. °· For a more rapid response, add gentle compression to the ice. °· Ice for no more than 10 to 20 minutes at a time. The bonier the area you are icing, the less time it will take to get the benefits of ice. °· Check your skin after 5 minutes to make sure there are no signs of a poor response to cold or skin damage. °· Rest 20 minutes or more between uses. °· Once your skin is numb, you can end your treatment. You can test numbness by very lightly touching your skin. The touch should be so light that you do not see the skin dimple from the pressure of your fingertip. When using ice, most people will feel these normal sensations in this order: cold, burning, aching, and numbness. °· Do not use ice on someone who cannot communicate their responses to pain, such as small children or people with dementia. °HOW TO MAKE AN ICE PACK °Ice packs are the most common way to use ice therapy. Other methods include ice massage, ice baths, and cryosprays. Muscle creams that cause a cold, tingly feeling do not offer the same benefits that ice offers and should not be used as a substitute unless recommended by your caregiver. °To make an ice pack, do one of the following: °· Place crushed ice or a   bag of frozen vegetables in a sealable plastic bag. Squeeze out the excess air. Place this bag inside another plastic bag. Slide the bag into a pillowcase or place a damp towel between your skin and the bag. °· Mix 3 parts water with 1 part rubbing alcohol. Freeze the mixture in a sealable plastic bag. When you remove the mixture from the freezer, it will be slushy. Squeeze out the excess air. Place this bag inside another plastic bag. Slide the bag into a pillowcase or place a damp towel between your skin and the bag. °SEEK MEDICAL CARE  IF: °· You develop white spots on your skin. This may give the skin a blotchy (mottled) appearance. °· Your skin turns blue or pale. °· Your skin becomes waxy or hard. °· Your swelling gets worse. °MAKE SURE YOU:  °· Understand these instructions. °· Will watch your condition. °· Will get help right away if you are not doing well or get worse. °Document Released: 03/12/2011 Document Revised: 11/30/2013 Document Reviewed: 03/12/2011 °ExitCare® Patient Information ©2015 [employer], LLC. This information is not intended to replace advice given to you by your health care provider. Make sure you discuss any questions you have with your health care provider. ° °

## 2015-03-30 NOTE — ED Notes (Signed)
Pt has localized hardened area surrounding an injection site from 8/27, mild redness and tenderness, no evidence of cellulitis.  Pt states that her throat feels much better from the other night though.

## 2015-03-30 NOTE — ED Notes (Signed)
Pt states she was here several nights ago and had to get a shot and the site is swollen and red

## 2015-03-30 NOTE — ED Provider Notes (Signed)
CSN: [id number]     Arrival date & time 03/30/15  0040 History   First MD Initiated Contact with Patient 03/30/15 0048     Chief Complaint  Patient presents with  . Injection site swollen      (Consider location/radiation/quality/duration/timing/severity/associated sxs/prior Treatment) Patient is a 29 y.o. female presenting with wound check. The history is provided by the patient.  Wound Check This is a new problem. The current episode started 2 days ago. The problem occurs constantly. The problem has not changed since onset.Pertinent negatives include no chest pain, no abdominal pain, no headaches and no shortness of breath. Nothing aggravates the symptoms. Nothing relieves the symptoms. She has tried nothing for the symptoms. The treatment provided no relief.  swelling at site of bicillin injection.    Past Medical History  Diagnosis Date  . Blood transfusion without reported diagnosis   . GERD (gastroesophageal reflux disease)     Occasional  . H/O ETOH abuse   . Cardiomyopathy   . Opiate abuse, episodic   . MI (myocardial infarction)    Past Surgical History  Procedure Laterality Date  . Dilation and curettage of uterus      August 2011  . Dilation and curettage of uterus     Family History  Problem Relation Age of Onset  . Family history unknown: Yes  . Heart disease Mother   . Arthritis Maternal Grandmother   . Hypertension Maternal Grandmother   . Ovarian cancer Maternal Grandmother   . Lung cancer Maternal Grandmother    Social History  Substance Use Topics  . Smoking status: Current Every Day Smoker -- 1.00 packs/day for 10 years    Types: Cigarettes  . Smokeless tobacco: Never Used  . Alcohol Use: No     Comment: etoh abuse (recovering since 10/02/2012)   OB History    Gravida Para Term Preterm AB TAB SAB Ectopic Multiple Living   0 0 0 0 0 0 0 0       Review of Systems  Constitutional: Negative for fever.  Respiratory: Negative for shortness of breath.    Cardiovascular: Negative for chest pain.  Gastrointestinal: Negative for abdominal pain.  Neurological: Negative for headaches.  All other systems reviewed and are negative.     Allergies  Latex  Home Medications   Prior to Admission medications   Medication Sig Start Date End Date Taking? Authorizing Provider  ibuprofen (ADVIL,MOTRIN) 200 MG tablet Take 400 mg by mouth every 6 (six) hours as needed (pain).    Historical Provider, MD   BP 123/83 mmHg  Pulse 119  Temp(Src) 98.4 F (36.9 C) (Oral)  Resp 18  Ht 5\' 4"  (1.626 m)  Wt 150 lb (68.04 kg)  BMI 25.73 kg/m2  SpO2 96%  LMP 03/09/2015 Physical Exam  Constitutional: She is oriented to person, place, and time. She appears well-developed and well-nourished. No distress.  HENT:  Head: Normocephalic and atraumatic.  Mouth/Throat: Oropharynx is clear and moist.  Eyes: EOM are normal. Pupils are equal, round, and reactive to light.  Neck: Normal range of motion. Neck supple.  Cardiovascular: Normal rate, regular rhythm and intact distal pulses.   Pulmonary/Chest: Effort normal and breath sounds normal. No respiratory distress. She has no wheezes. She has no rales.  Abdominal: Soft. Bowel sounds are normal.  Musculoskeletal: Normal range of motion.  Neurological: She is alert and oriented to person, place, and time.  Skin: Skin is warm and dry.  Bruising with subcutaneous hematoma on right  buttock injection site.  No warmth nor erythema no fluctuance.    Psychiatric: She has a normal mood and affect.    ED Course  Procedures (including critical care time) Labs Review Labs Reviewed - No data to display  Imaging Review No results found. I have personally reviewed and evaluated these images and lab results as part of my medical decision-making.   EKG Interpretation None      MDM   Final diagnoses:  None    Hematoma at injection site.  Ice area.  Ibuprofen.  No signs of infection.  PRN follow up with surgery.       Cy Blamer, MD 03/30/15 [phone]

## 2015-04-13 ENCOUNTER — Emergency Department (HOSPITAL_BASED_OUTPATIENT_CLINIC_OR_DEPARTMENT_OTHER): Payer: Self-pay

## 2015-04-13 ENCOUNTER — Emergency Department (HOSPITAL_BASED_OUTPATIENT_CLINIC_OR_DEPARTMENT_OTHER)
Admission: EM | Admit: 2015-04-13 | Discharge: 2015-04-13 | Disposition: A | Discharge status: Home / Self Care | Payer: Self-pay | Attending: Emergency Medicine | Admitting: Emergency Medicine

## 2015-04-13 ENCOUNTER — Encounter (HOSPITAL_BASED_OUTPATIENT_CLINIC_OR_DEPARTMENT_OTHER): Payer: Self-pay | Admitting: *Deleted

## 2015-04-13 DIAGNOSIS — R1084 Generalized abdominal pain: Secondary | ICD-10-CM | POA: Insufficient documentation

## 2015-04-13 DIAGNOSIS — J209 Acute bronchitis, unspecified: Secondary | ICD-10-CM | POA: Insufficient documentation

## 2015-04-13 DIAGNOSIS — Z720 Tobacco use: Secondary | ICD-10-CM | POA: Insufficient documentation

## 2015-04-13 DIAGNOSIS — Z8679 Personal history of other diseases of the circulatory system: Secondary | ICD-10-CM | POA: Insufficient documentation

## 2015-04-13 DIAGNOSIS — Z8719 Personal history of other diseases of the digestive system: Secondary | ICD-10-CM | POA: Insufficient documentation

## 2015-04-13 DIAGNOSIS — Z91040 Latex allergy status: Secondary | ICD-10-CM | POA: Insufficient documentation

## 2015-04-13 DIAGNOSIS — I252 Old myocardial infarction: Secondary | ICD-10-CM | POA: Insufficient documentation

## 2015-04-13 NOTE — ED Provider Notes (Addendum)
CSN: [id number]     Arrival date & time 04/13/15  0209 History   First MD Initiated Contact with Patient 04/13/15 [phone]     Chief Complaint  Patient presents with  . Cough     (Consider location/radiation/quality/duration/timing/severity/associated sxs/prior Treatment) HPI  This is a 29 year old female with a 10 day history of URI symptoms. Specifically she has had nasal congestion and productive cough. The cough had been productive of green and blood-tinged sputum but this is now improving. The patient is downplaying her symptoms and refused a chest x-ray because "I'm getting better". She has not had a recent fever. She is not currently short of breath or wheezing. She has been using over-the-counter cough/cold preparations with some relief.  Past Medical History  Diagnosis Date  . Blood transfusion without reported diagnosis   . GERD (gastroesophageal reflux disease)     Occasional  . H/O ETOH abuse   . Cardiomyopathy   . Opiate abuse, episodic   . MI (myocardial infarction)    Past Surgical History  Procedure Laterality Date  . Dilation and curettage of uterus      August 2011  . Dilation and curettage of uterus     Family History  Problem Relation Age of Onset  . Family history unknown: Yes  . Heart disease Mother   . Arthritis Maternal Grandmother   . Hypertension Maternal Grandmother   . Ovarian cancer Maternal Grandmother   . Lung cancer Maternal Grandmother    Social History  Substance Use Topics  . Smoking status: Current Every Day Smoker -- 1.00 packs/day for 10 years    Types: Cigarettes  . Smokeless tobacco: Never Used  . Alcohol Use: No     Comment: etoh abuse (recovering since 10/02/2012)   OB History    Gravida Para Term Preterm AB TAB SAB Ectopic Multiple Living   0 0 0 0 0 0 0 0       Review of Systems  All other systems reviewed and are negative.   Allergies  Latex  Home Medications   Prior to Admission medications   Medication Sig Start  Date End Date Taking? Authorizing Provider  ibuprofen (ADVIL,MOTRIN) 200 MG tablet Take 400 mg by mouth every 6 (six) hours as needed (pain).    Historical Provider, MD   BP 124/88 mmHg  Pulse 105  Temp(Src) 97.9 F (36.6 C) (Oral)  SpO2 97%  LMP 03/09/2015   Physical Exam  General: Well-developed, well-nourished female in no acute distress; appearance consistent with age of record HENT: normocephalic; atraumatic; pharynx normal; nasal congestion Eyes: pupils equal, round and reactive to light; extraocular muscles intact Neck: supple Heart: regular rate and rhythm Lungs: clear to auscultation bilaterally Abdomen: soft; nondistended; mild diffuse tenderness; no masses or hepatosplenomegaly; bowel sounds present Extremities: No deformity; full range of motion; pulses normal Neurologic: Awake, alert and oriented; motor function intact in all extremities and symmetric; no facial droop Skin: Warm and dry Psychiatric: Normal mood and affect    ED Course  Procedures (including critical care time)   MDM      Paula Libra, MD 04/13/15 0423  Paula Libra, MD 04/13/15 5621

## 2015-04-13 NOTE — Discharge Instructions (Signed)

## 2015-06-02 ENCOUNTER — Emergency Department (HOSPITAL_BASED_OUTPATIENT_CLINIC_OR_DEPARTMENT_OTHER): Payer: Self-pay

## 2015-06-02 ENCOUNTER — Emergency Department (HOSPITAL_BASED_OUTPATIENT_CLINIC_OR_DEPARTMENT_OTHER)
Admission: EM | Admit: 2015-06-02 | Discharge: 2015-06-02 | Disposition: A | Discharge status: Home / Self Care | Payer: Self-pay | Attending: Emergency Medicine | Admitting: Emergency Medicine

## 2015-06-02 ENCOUNTER — Encounter (HOSPITAL_BASED_OUTPATIENT_CLINIC_OR_DEPARTMENT_OTHER): Payer: Self-pay | Admitting: *Deleted

## 2015-06-02 DIAGNOSIS — Z91040 Latex allergy status: Secondary | ICD-10-CM | POA: Insufficient documentation

## 2015-06-02 DIAGNOSIS — Z720 Tobacco use: Secondary | ICD-10-CM | POA: Insufficient documentation

## 2015-06-02 DIAGNOSIS — S8992XA Unspecified injury of left lower leg, initial encounter: Secondary | ICD-10-CM | POA: Insufficient documentation

## 2015-06-02 DIAGNOSIS — Y9239 Other specified sports and athletic area as the place of occurrence of the external cause: Secondary | ICD-10-CM | POA: Insufficient documentation

## 2015-06-02 DIAGNOSIS — Y998 Other external cause status: Secondary | ICD-10-CM | POA: Insufficient documentation

## 2015-06-02 DIAGNOSIS — Y9368 Activity, volleyball (beach) (court): Secondary | ICD-10-CM | POA: Insufficient documentation

## 2015-06-02 DIAGNOSIS — Z8719 Personal history of other diseases of the digestive system: Secondary | ICD-10-CM | POA: Insufficient documentation

## 2015-06-02 DIAGNOSIS — I252 Old myocardial infarction: Secondary | ICD-10-CM | POA: Insufficient documentation

## 2015-06-02 DIAGNOSIS — M25562 Pain in left knee: Secondary | ICD-10-CM

## 2015-06-02 DIAGNOSIS — W228XXA Striking against or struck by other objects, initial encounter: Secondary | ICD-10-CM | POA: Insufficient documentation

## 2015-06-02 MED ORDER — IBUPROFEN 600 MG PO TABS: 600.0000 mg | ORAL_TABLET | Freq: Four times a day (QID) | ORAL | Status: DC | PRN

## 2015-06-02 NOTE — ED Notes (Signed)
Ace bandage applied to left knee.

## 2015-06-02 NOTE — ED Provider Notes (Signed)
CSN: [id number]     Arrival date & time 06/02/15  1926 History   First MD Initiated Contact with Patient 06/02/15 1940     Chief Complaint  Patient presents with  . Knee Pain    HPI  29 year old female presents today with left knee pain. Patient reports she's playing volleyball when she went to dive for a ball hitting her left knee on the floor. She reports immediate pain at that time, she reports she was able to ambulate. No significant obvious signs of trauma to the knee, no loss of distal sensation strength or motor function. Patient denies any significant past medical history of the same, has not tried any over-the-counter medications prior to arrival. Past Medical History  Diagnosis Date  . Blood transfusion without reported diagnosis   . GERD (gastroesophageal reflux disease)     Occasional  . H/O ETOH abuse   . Cardiomyopathy (HCC)   . Opiate abuse, episodic   . MI (myocardial infarction) [facility](HCC)    Past Surgical History  Procedure Laterality Date  . Dilation and curettage of uterus      August 2011  . Dilation and curettage of uterus     Family History  Problem Relation Age of Onset  . Family history unknown: Yes  . Heart disease Mother   . Arthritis Maternal Grandmother   . Hypertension Maternal Grandmother   . Ovarian cancer Maternal Grandmother   . Lung cancer Maternal Grandmother    Social History  Substance Use Topics  . Smoking status: Current Every Day Smoker -- 1.00 packs/day for 10 years    Types: Cigarettes  . Smokeless tobacco: Never Used  . Alcohol Use: No     Comment: etoh abuse (recovering since 10/02/2012)   OB History    Gravida Para Term Preterm AB TAB SAB Ectopic Multiple Living   0 0 0 0 0 0 0 0       Review of Systems  All other systems reviewed and are negative.   Allergies  Latex  Home Medications   Prior to Admission medications   Medication Sig Start Date End Date Taking? Authorizing Provider  ibuprofen (ADVIL,MOTRIN) 600 MG  tablet Take 1 tablet (600 mg total) by mouth every 6 (six) hours as needed. 06/02/15   Keelin Sheridan, PA-C   BP 119/94 mmHg  Pulse 107  Temp(Src) 98.2 F (36.8 C) (Oral)  Resp 20  Ht 5\' 4"  (1.626 m)  Wt 150 lb (68.04 kg)  BMI 25.73 kg/m2  SpO2 100%  LMP 05/26/2015   Physical Exam  Constitutional: She is oriented to person, place, and time. She appears well-developed and well-nourished.  HENT:  Head: Normocephalic and atraumatic.  Eyes: Conjunctivae are normal. Pupils are equal, round, and reactive to light. Right eye exhibits no discharge. Left eye exhibits no discharge. No scleral icterus.  Neck: Normal range of motion. No JVD present. No tracheal deviation present.  Pulmonary/Chest: Effort normal. No stridor.  Musculoskeletal:  Minor swelling to the left anterior knee, no obvious signs of soft tissue injury, no laxity of the patella, valgus varus normal, Lachman's normal, posterior drawer normal. Patients able ambulate with minimal difficulty, strength intact  Neurological: She is alert and oriented to person, place, and time. Coordination normal.  Psychiatric: She has a normal mood and affect. Her behavior is normal. Judgment and thought content normal.  Nursing note and vitals reviewed.   ED Course  Procedures (including critical care time) Labs Review Labs Reviewed - No data to  display  Imaging Review Dg Knee Complete 4 Views Left  06/02/2015  CLINICAL DATA:  29 year old female with acute left knee pain following fall yesterday. Initial encounter. EXAM: LEFT KNEE - COMPLETE 4+ VIEW COMPARISON:  None. FINDINGS: There is no evidence of fracture, dislocation, or joint effusion. There is no evidence of arthropathy or other focal bone abnormality. Mild anterior soft tissue swelling is noted. IMPRESSION: Soft tissue swelling without acute bony abnormality. Electronically Signed   By: Harmon Pier M.D.   On: 06/02/2015 20:08   I have personally reviewed and evaluated these images and  lab results as part of my medical decision-making.   EKG Interpretation None      MDM   Final diagnoses:  Left knee pain    Labs:  Imaging:  Consults:  Therapeutics:  Discharge Meds:   Assessment/Plan: Patient presents to the emergency room with left knee pain, no bony abnormality on plain films, patient is able to ambulate. Patient will be instructed to use ice, ibuprofen, rest, follow-up with primary care in 1 week if symptoms persist. She verbalizes understanding and agreement today's plan and had no further questions or concerns at time of discharge.         Eyvonne Mechanic, PA-C 06/02/15 1610  Geoffery Lyons, MD 06/02/15 2142

## 2015-06-02 NOTE — Discharge Instructions (Signed)

## 2015-06-02 NOTE — ED Notes (Signed)
Left knee pain since yesterday after playing volleyball. [facility]

## 2015-06-02 NOTE — ED Notes (Signed)
Patient ambulatory and stable.  Patient verbalizes understanding of discharge instructions and medications.

## 2015-06-09 ENCOUNTER — Encounter (HOSPITAL_BASED_OUTPATIENT_CLINIC_OR_DEPARTMENT_OTHER): Payer: Self-pay | Admitting: *Deleted

## 2015-06-09 ENCOUNTER — Emergency Department (HOSPITAL_BASED_OUTPATIENT_CLINIC_OR_DEPARTMENT_OTHER)
Admission: EM | Admit: 2015-06-09 | Discharge: 2015-06-09 | Disposition: A | Discharge status: Home / Self Care | Payer: Self-pay | Attending: Emergency Medicine | Admitting: Emergency Medicine

## 2015-06-09 DIAGNOSIS — N898 Other specified noninflammatory disorders of vagina: Secondary | ICD-10-CM | POA: Insufficient documentation

## 2015-06-09 DIAGNOSIS — I252 Old myocardial infarction: Secondary | ICD-10-CM | POA: Insufficient documentation

## 2015-06-09 DIAGNOSIS — M5441 Lumbago with sciatica, right side: Secondary | ICD-10-CM | POA: Insufficient documentation

## 2015-06-09 DIAGNOSIS — Z3202 Encounter for pregnancy test, result negative: Secondary | ICD-10-CM | POA: Insufficient documentation

## 2015-06-09 DIAGNOSIS — Z8719 Personal history of other diseases of the digestive system: Secondary | ICD-10-CM | POA: Insufficient documentation

## 2015-06-09 DIAGNOSIS — Z91040 Latex allergy status: Secondary | ICD-10-CM | POA: Insufficient documentation

## 2015-06-09 DIAGNOSIS — Z79899 Other long term (current) drug therapy: Secondary | ICD-10-CM | POA: Insufficient documentation

## 2015-06-09 DIAGNOSIS — Z720 Tobacco use: Secondary | ICD-10-CM | POA: Insufficient documentation

## 2015-06-09 LAB — URINALYSIS, ROUTINE W REFLEX MICROSCOPIC
Bilirubin Urine: NEGATIVE
GLUCOSE, UA: NEGATIVE mg/dL
HGB URINE DIPSTICK: NEGATIVE
KETONES UR: NEGATIVE mg/dL
LEUKOCYTES UA: NEGATIVE
Nitrite: NEGATIVE
PH: 8 (ref 5.0–8.0)
Protein, ur: NEGATIVE mg/dL
Specific Gravity, Urine: 1.012 (ref 1.005–1.030)
Urobilinogen, UA: 0.2 mg/dL (ref 0.0–1.0)

## 2015-06-09 LAB — PREGNANCY, URINE: Preg Test, Ur: NEGATIVE

## 2015-06-09 LAB — WET PREP, GENITAL
Clue Cells Wet Prep HPF POC: NONE SEEN
TRICH WET PREP: NONE SEEN
YEAST WET PREP: NONE SEEN

## 2015-06-09 MED ORDER — IBUPROFEN 600 MG PO TABS: 600.0000 mg | ORAL_TABLET | Freq: Four times a day (QID) | ORAL | Status: DC | PRN

## 2015-06-09 NOTE — ED Notes (Signed)
Pelvic at bedside   

## 2015-06-09 NOTE — ED Notes (Addendum)
C/o low back pain down right leg. No injury. States she recently played volleyball. Hx of back injury. Pt also c/o yellow vaginal discharge x 2 days. [facility]

## 2015-06-09 NOTE — ED Provider Notes (Signed)
CSN: [id number]     Arrival date & time 06/09/15  1108 History   First MD Initiated Contact with Patient 06/09/15 1209     No chief complaint on file.    (Consider location/radiation/quality/duration/timing/severity/associated sxs/prior Treatment) HPI Comments: Pt comes in with c/o right back pain that radiates down her right left to her thigh. Denies numbness, weakness or incontinence. No fever. Pt states that she has also noted a little vaginal discharge. She states that she was recently treated for trich and she wanted to make sure that it is gone. No dysuria.  The history is provided by the patient. No language interpreter was used.    Past Medical History  Diagnosis Date  . Blood transfusion without reported diagnosis   . GERD (gastroesophageal reflux disease)     Occasional  . H/O ETOH abuse   . Cardiomyopathy (HCC)   . Opiate abuse, episodic   . MI (myocardial infarction) [facility](HCC)    Past Surgical History  Procedure Laterality Date  . Dilation and curettage of uterus      August 2011  . Dilation and curettage of uterus     Family History  Problem Relation Age of Onset  . Family history unknown: Yes  . Heart disease Mother   . Arthritis Maternal Grandmother   . Hypertension Maternal Grandmother   . Ovarian cancer Maternal Grandmother   . Lung cancer Maternal Grandmother    Social History  Substance Use Topics  . Smoking status: Current Every Day Smoker -- 1.00 packs/day for 10 years    Types: Cigarettes  . Smokeless tobacco: Never Used  . Alcohol Use: No     Comment: etoh abuse (recovering since 10/02/2012)   OB History    Gravida Para Term Preterm AB TAB SAB Ectopic Multiple Living   0 0 0 0 0 0 0 0       Review of Systems  All other systems reviewed and are negative.     Allergies  Latex  Home Medications   Prior to Admission medications   Medication Sig Start Date End Date Taking? Authorizing Provider  ibuprofen (ADVIL,MOTRIN) 600 MG tablet Take 1  tablet (600 mg total) by mouth every 6 (six) hours as needed. 06/02/15  Yes Jeffrey Hedges, PA-C  QUEtiapine (SEROQUEL) 200 MG tablet Take 200 mg by mouth at bedtime.   Yes Historical Provider, MD  topiramate (TOPAMAX) 15 MG capsule Take 50 mg by mouth 3 (three) times daily.   Yes Historical Provider, MD  ibuprofen (ADVIL,MOTRIN) 600 MG tablet Take 1 tablet (600 mg total) by mouth every 6 (six) hours as needed. 06/09/15   Teressa LowerVrinda Lake Cinquemani, NP   BP 111/83 mmHg  Pulse 100  Temp(Src) 97.6 F (36.4 C) (Oral)  Resp 16  Ht 5\' 4"  (1.626 m)  Wt 150 lb (68.04 kg)  BMI 25.73 kg/m2  SpO2 98%  LMP 06/02/2015 Physical Exam  Constitutional: She is oriented to person, place, and time. She appears well-developed and well-nourished.  Cardiovascular: Normal rate.   Pulmonary/Chest: Effort normal and breath sounds normal.  Abdominal: Bowel sounds are normal. There is no tenderness.  Genitourinary: Vaginal discharge found.  -cmt  Musculoskeletal:  Right lumbar paraspinal tenderness. Full rom and sensation to bilateral lower extremities  Neurological: She is alert and oriented to person, place, and time. She exhibits normal muscle tone. Coordination normal.  Skin: Skin is warm and dry.  Psychiatric: She has a normal mood and affect.  Nursing note and vitals reviewed.  ED Course  Procedures (including critical care time) Labs Review Labs Reviewed  WET PREP, GENITAL - Abnormal; Notable for the following:    WBC, Wet Prep HPF POC FEW (*)    All other components within normal limits  URINALYSIS, ROUTINE W REFLEX MICROSCOPIC (NOT AT [facility]) - Abnormal; Notable for the following:    APPearance CLOUDY (*)    All other components within normal limits  PREGNANCY, URINE  GC/CHLAMYDIA PROBE AMP ([place]) NOT AT [facility]    Imaging Review No results found. I have personally reviewed and evaluated these images and lab results as part of my medical decision-making.   EKG Interpretation None      MDM    Final diagnoses:  Right-sided low back pain with right-sided sciatica  Vaginal discharge    Pt neurologically intact. Minor white discharge. Std testing sent. Pt given ibuprofen for the symptoms   Teressa Lower, NP 06/09/15 1335  Gwyneth Sprout, MD 06/10/15 [phone]

## 2015-06-09 NOTE — Discharge Instructions (Signed)

## 2015-06-09 NOTE — ED Notes (Signed)
Noticed pt leaving room and walking down hall. Asked pt if she needed anything. States I'm going outside to smoke". Instructed pt she could not smoke even on hosp grounds and if MD were to make rounds she would miss being seen by MD. Pt went back to room.

## 2015-06-10 LAB — GC/CHLAMYDIA PROBE AMP (~~LOC~~) NOT AT ARMC
Chlamydia: NEGATIVE
Neisseria Gonorrhea: NEGATIVE

## 2016-11-27 HISTORY — PX: DILATION AND CURETTAGE OF UTERUS: SHX78

## 2017-04-10 ENCOUNTER — Emergency Department (HOSPITAL_BASED_OUTPATIENT_CLINIC_OR_DEPARTMENT_OTHER)
Admission: EM | Admit: 2017-04-10 | Discharge: 2017-04-10 | Disposition: A | Discharge status: Home / Self Care | Payer: Medicaid Other | Attending: Emergency Medicine | Admitting: Emergency Medicine

## 2017-04-10 ENCOUNTER — Encounter (HOSPITAL_BASED_OUTPATIENT_CLINIC_OR_DEPARTMENT_OTHER): Payer: Self-pay | Admitting: Emergency Medicine

## 2017-04-10 DIAGNOSIS — F17210 Nicotine dependence, cigarettes, uncomplicated: Secondary | ICD-10-CM | POA: Insufficient documentation

## 2017-04-10 DIAGNOSIS — R112 Nausea with vomiting, unspecified: Secondary | ICD-10-CM | POA: Diagnosis not present

## 2017-04-10 DIAGNOSIS — Z79899 Other long term (current) drug therapy: Secondary | ICD-10-CM | POA: Insufficient documentation

## 2017-04-10 DIAGNOSIS — R1110 Vomiting, unspecified: Secondary | ICD-10-CM | POA: Diagnosis present

## 2017-04-10 DIAGNOSIS — I252 Old myocardial infarction: Secondary | ICD-10-CM | POA: Diagnosis not present

## 2017-04-10 LAB — CBC WITH DIFFERENTIAL/PLATELET
Basophils Absolute: 0.1 10*3/uL (ref 0.0–0.1)
Basophils Relative: 1 %
EOS ABS: 0.1 10*3/uL (ref 0.0–0.7)
EOS PCT: 1 %
HCT: 39.6 % (ref 36.0–46.0)
Hemoglobin: 13.3 g/dL (ref 12.0–15.0)
LYMPHS ABS: 2.5 10*3/uL (ref 0.7–4.0)
Lymphocytes Relative: 37 %
MCH: 31.4 pg (ref 26.0–34.0)
MCHC: 33.6 g/dL (ref 30.0–36.0)
MCV: 93.4 fL (ref 78.0–100.0)
MONO ABS: 0.6 10*3/uL (ref 0.1–1.0)
Monocytes Relative: 8 %
Neutro Abs: 3.6 10*3/uL (ref 1.7–7.7)
Neutrophils Relative %: 53 %
PLATELETS: 206 10*3/uL (ref 150–400)
RBC: 4.24 MIL/uL (ref 3.87–5.11)
RDW: 11.7 % (ref 11.5–15.5)
WBC: 6.8 10*3/uL (ref 4.0–10.5)

## 2017-04-10 LAB — COMPREHENSIVE METABOLIC PANEL
ALBUMIN: 4.5 g/dL (ref 3.5–5.0)
ALT: 8 U/L — AB (ref 14–54)
AST: 17 U/L (ref 15–41)
Alkaline Phosphatase: 47 U/L (ref 38–126)
Anion gap: 8 (ref 5–15)
BILIRUBIN TOTAL: 0.4 mg/dL (ref 0.3–1.2)
BUN: 9 mg/dL (ref 6–20)
CO2: 27 mmol/L (ref 22–32)
CREATININE: 0.54 mg/dL (ref 0.44–1.00)
Calcium: 9.3 mg/dL (ref 8.9–10.3)
Chloride: 102 mmol/L (ref 101–111)
GFR calc Af Amer: >60 mL/min (ref >60)
GFR calc non Af Amer: >60 mL/min (ref >60)
GLUCOSE: 96 mg/dL (ref 65–99)
Potassium: 3.7 mmol/L (ref 3.5–5.1)
Sodium: 137 mmol/L (ref 135–145)
Total Protein: 7.7 g/dL (ref 6.5–8.1)

## 2017-04-10 LAB — URINALYSIS, ROUTINE W REFLEX MICROSCOPIC
BILIRUBIN URINE: NEGATIVE
GLUCOSE, UA: NEGATIVE mg/dL
HGB URINE DIPSTICK: NEGATIVE
KETONES UR: NEGATIVE mg/dL
Nitrite: NEGATIVE
PH: 6.5 (ref 5.0–8.0)
Protein, ur: NEGATIVE mg/dL
Specific Gravity, Urine: 1.02 (ref 1.005–1.030)

## 2017-04-10 LAB — URINALYSIS, MICROSCOPIC (REFLEX): RBC / HPF: NONE SEEN RBC/hpf (ref 0–5)

## 2017-04-10 LAB — PREGNANCY, URINE: PREG TEST UR: NEGATIVE

## 2017-04-10 MED ORDER — SODIUM CHLORIDE 0.9 % IV BOLUS (SEPSIS)
1000.0000 mL | Freq: Once | INTRAVENOUS | Status: AC
  Administered 2017-04-10: 1000 mL via INTRAVENOUS

## 2017-04-10 MED ORDER — ACETAMINOPHEN 325 MG PO TABS
650.0000 mg | ORAL_TABLET | Freq: Once | ORAL | Status: AC
  Administered 2017-04-10: 650 mg via ORAL
  Filled 2017-04-10: qty 2

## 2017-04-10 MED ORDER — ONDANSETRON 4 MG PO TBDP: 4.0000 mg | ORAL_TABLET | Freq: Three times a day (TID) | ORAL | 0 refills | Status: DC | PRN

## 2017-04-10 MED ORDER — ONDANSETRON HCL 4 MG/2ML IJ SOLN
4.0000 mg | Freq: Once | INTRAMUSCULAR | Status: AC
  Administered 2017-04-10: 4 mg via INTRAVENOUS
  Filled 2017-04-10: qty 2

## 2017-04-10 NOTE — ED Provider Notes (Signed)
[facility] Provider Note   CSN: [id number] Arrival date & time: 04/10/17  1721     History   Chief Complaint Chief Complaint  Patient presents with  . Emesis    HPI Sandra [name] is a 31 y.o. female.  HPI  31 year old female presents with vomiting since around 3 AM. She felt fine when she went to bed last night. She had [employer] but did not think it tasted bad or funny. She has vomited about 10 times today. No blood in her emesis. No diarrhea, abdominal pain. At about 11 AM she developed a moderate headache and has had one since. It is frontal. She is able to drink fluids but not food. Sometimes she vomits without ingesting anything. No fevers, back pain, flank pain, neck pain or stiffness, chest pain, cough, or urinary symptoms. She recently had a new birth control, a NuvaRing placed. Has had irregular menstrual cycles since having a D&C several months ago which is one reason she had the NuvaRing placed.  Past Medical History:  Diagnosis Date  . Blood transfusion without reported diagnosis   . Cardiomyopathy (HCC)   . GERD (gastroesophageal reflux disease)    Occasional  . H/O ETOH abuse   . MI (myocardial infarction) (HCC)   . Opiate abuse, episodic     Patient Active Problem List   Diagnosis Date Noted  . Dysuria 10/15/2014  . Routine general medical examination at a health care facility 06/18/2014  . Soreness breast 06/18/2014  . Vaginal discharge 06/10/2014  . Anxiety 05/19/2014  . Tachycardia 05/19/2014  . Low back pain 05/19/2014    Past Surgical History:  Procedure Laterality Date  . DILATION AND CURETTAGE OF UTERUS     August 2011  . DILATION AND CURETTAGE OF UTERUS    . DILATION AND CURETTAGE OF UTERUS  11/2016    OB History    Gravida Para Term Preterm AB Living   0 0 0 0 0     SAB TAB Ectopic Multiple Live Births   0 0 0           Home Medications    Prior to Admission medications   Medication Sig Start Date End Date  Taking? Authorizing Provider  etonogestrel-ethinyl estradiol (NUVARING) 0.12-0.015 MG/24HR vaginal ring Place 1 each vaginally every 28 (twenty-eight) days. Insert vaginally and leave in place for 3 consecutive weeks, then remove for 1 week.   Yes [provider]  ibuprofen (ADVIL,MOTRIN) 600 MG tablet Take 1 tablet (600 mg total) by mouth every 6 (six) hours as needed. 06/02/15   Hedges, Tinnie GensJeffrey, PA-C  ibuprofen (ADVIL,MOTRIN) 600 MG tablet Take 1 tablet (600 mg total) by mouth every 6 (six) hours as needed. 06/09/15   Teressa LowerPickering, Vrinda, NP  ondansetron (ZOFRAN ODT) 4 MG disintegrating tablet Take 1 tablet (4 mg total) by mouth every 8 (eight) hours as needed for nausea or vomiting. 04/10/17   Pricilla LovelessGoldston, Wilhelm Ganaway, MD  QUEtiapine (SEROQUEL) 200 MG tablet Take 200 mg by mouth at bedtime.    [provider]  topiramate (TOPAMAX) 15 MG capsule Take 50 mg by mouth 3 (three) times daily.    [provider]    Family History Family History  Problem Relation Age of Onset  . Family history unknown: Yes  . Heart disease Mother   . Arthritis Maternal Grandmother   . Hypertension Maternal Grandmother   . Ovarian cancer Maternal Grandmother   . Lung cancer Maternal Grandmother     Social History  Social History  Substance Use Topics  . Smoking status: Current Every Day Smoker    Packs/day: 1.00    Years: 10.00    Types: Cigarettes  . Smokeless tobacco: Never Used  . Alcohol use No     Comment: etoh abuse (recovering since 10/02/2012)     Allergies   Latex   Review of Systems Review of Systems  Constitutional: Negative for fever.  Respiratory: Negative for shortness of breath.   Cardiovascular: Negative for chest pain.  Gastrointestinal: Positive for nausea and vomiting. Negative for abdominal pain, blood in stool and diarrhea.  Genitourinary: Negative for dysuria, hematuria, vaginal bleeding and vaginal discharge.  Musculoskeletal: Negative for neck pain and neck  stiffness.  Neurological: Positive for headaches. Negative for weakness and numbness.  All other systems reviewed and are negative.    Physical Exam Updated Vital Signs BP 112/82 (BP Location: Left Arm)   Pulse 90   Temp 98.7 F (37.1 C) (Oral)   Resp 18   Ht  (1.6 m)   Wt 63.5 kg (140 lb)   SpO2 100%   BMI 24.80 kg/m   Physical Exam  Constitutional: She is oriented to person, place, and time. She appears well-developed and well-nourished.  HENT:  Head: Normocephalic and atraumatic.  Right Ear: External ear normal.  Left Ear: External ear normal.  Nose: Nose normal.  Mouth/Throat: Oropharynx is clear and moist. No oropharyngeal exudate.  Eyes: Pupils are equal, round, and reactive to light. EOM are normal. Right eye exhibits no discharge. Left eye exhibits no discharge.  Neck: Normal range of motion. Neck supple.  Cardiovascular: Normal rate, regular rhythm and normal heart sounds.   Pulmonary/Chest: Effort normal and breath sounds normal.  Abdominal: Soft. She exhibits no distension. There is no tenderness.  Neurological: She is alert and oriented to person, place, and time.  CN 3-12 grossly intact. 5/5 strength in all 4 extremities. Grossly normal sensation. Normal finger to nose.   Skin: Skin is warm and dry.  Nursing note and vitals reviewed.    ED Treatments / Results  Labs (all labs ordered are listed, but only abnormal results are displayed) Labs Reviewed  COMPREHENSIVE METABOLIC PANEL - Abnormal; Notable for the following:       Result Value   ALT 8 (*)    All other components within normal limits  URINALYSIS, ROUTINE W REFLEX MICROSCOPIC - Abnormal; Notable for the following:    Leukocytes, UA TRACE (*)    All other components within normal limits  URINALYSIS, MICROSCOPIC (REFLEX) - Abnormal; Notable for the following:    Bacteria, UA RARE (*)    Squamous Epithelial / LPF 0-5 (*)    All other components within normal limits  CBC WITH  DIFFERENTIAL/PLATELET  PREGNANCY, URINE    EKG  EKG Interpretation None       Radiology No results found.  Procedures Procedures (including critical care time)  Medications Ordered in ED Medications  sodium chloride 0.9 % bolus 1,000 mL (0 mLs Intravenous Stopped 04/10/17 1916)  ondansetron (ZOFRAN) injection 4 mg (4 mg Intravenous Given 04/10/17 1818)  acetaminophen (TYLENOL) tablet 650 mg (650 mg Oral Given 04/10/17 1818)     Initial Impression / Assessment and Plan / ED Course  I have reviewed the triage vital signs and the nursing notes.  Pertinent labs & imaging results that were available during my care of the patient were reviewed by me and considered in my medical decision making (see chart for details).  Patient has not had any emesis while in the ED. Her exam is unremarkable. She does have a moderate frontal headache, this is likely from the degree of vomiting and some dehydration. Suspicion of acute CNS or intracranial emergency is quite low. Doubt subarachnoid hemorrhage. After fluids and Zofran she is now feeling much better. Probably a GI illness, at this point will treat with fluids and antibiotics. Discussed return precautions.  Final Clinical Impressions(s) / ED Diagnoses   Final diagnoses:  Nausea and vomiting in adult    New Prescriptions New Prescriptions   ONDANSETRON (ZOFRAN ODT) 4 MG DISINTEGRATING TABLET    Take 1 tablet (4 mg total) by mouth every 8 (eight) hours as needed for nausea or vomiting.     Pricilla Loveless, MD 04/10/17 [phone]

## 2017-04-10 NOTE — ED Triage Notes (Signed)
Reports vomiting since 3 am this morning.  Denies abdominal pain, diarrhea, dysuria, hematuria. Reports started nuvaring on Sunday.

## 2017-04-10 NOTE — ED Notes (Signed)
Pt verbalizes understanding of d/c instructions and denies any further needs at this time. 

## 2017-04-10 NOTE — ED Notes (Signed)
ED Provider at bedside. 

## 2017-08-08 ENCOUNTER — Emergency Department (HOSPITAL_BASED_OUTPATIENT_CLINIC_OR_DEPARTMENT_OTHER): Payer: Medicaid Other

## 2017-08-08 ENCOUNTER — Encounter (HOSPITAL_BASED_OUTPATIENT_CLINIC_OR_DEPARTMENT_OTHER): Payer: Self-pay | Admitting: *Deleted

## 2017-08-08 ENCOUNTER — Other Ambulatory Visit: Payer: Self-pay

## 2017-08-08 ENCOUNTER — Emergency Department (HOSPITAL_BASED_OUTPATIENT_CLINIC_OR_DEPARTMENT_OTHER)
Admission: EM | Admit: 2017-08-08 | Discharge: 2017-08-08 | Disposition: A | Discharge status: Home / Self Care | Payer: Medicaid Other | Attending: Emergency Medicine | Admitting: Emergency Medicine

## 2017-08-08 DIAGNOSIS — Z79899 Other long term (current) drug therapy: Secondary | ICD-10-CM | POA: Insufficient documentation

## 2017-08-08 DIAGNOSIS — J069 Acute upper respiratory infection, unspecified: Secondary | ICD-10-CM | POA: Insufficient documentation

## 2017-08-08 DIAGNOSIS — F17210 Nicotine dependence, cigarettes, uncomplicated: Secondary | ICD-10-CM | POA: Insufficient documentation

## 2017-08-08 DIAGNOSIS — B9789 Other viral agents as the cause of diseases classified elsewhere: Secondary | ICD-10-CM | POA: Insufficient documentation

## 2017-08-08 DIAGNOSIS — R05 Cough: Secondary | ICD-10-CM | POA: Diagnosis present

## 2017-08-08 MED ORDER — GUAIFENESIN-CODEINE 100-10 MG/5ML PO SYRP: 5.0000 mL | ORAL_SOLUTION | Freq: Three times a day (TID) | ORAL | 0 refills | Status: DC | PRN

## 2017-08-08 MED ORDER — BENZONATATE 100 MG PO CAPS: 100.0000 mg | ORAL_CAPSULE | Freq: Three times a day (TID) | ORAL | 0 refills | Status: DC

## 2017-08-08 NOTE — ED Provider Notes (Signed)
[facility] EMERGENCY DEPARTMENT Provider Note   CSN: [id number] Arrival date & time: 08/08/17  1237     History   Chief Complaint Chief Complaint  Patient presents with  . Cough    HPI Darlina RumpfJessica Transeau-Sipe is a 32 y.o. female.  HPI  Ms. Transeau-Sipe is a 32 year old female with a history of alcohol abuse, opiate abuse who presents to the emergency department for evaluation of productive cough, congestion, fever, chills and nausea/vomiting.  Patient states that her symptoms began 2 days ago.  States that cough is productive of a green sputum.  She reports associated shortness of breath with cough, but denies shortness of breath at rest.  She has gross chest soreness with coughing, denies chest pain in absence of cough.  States that she has taken Tylenol and over-the-counter cold and flu medicine for her symptoms without significant relief.  States that she vomited several times this morning, but has not been nauseated since and is able to drink p.o. Fluids at this time.  She denies close contacts with similar symptoms.  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  She denies sore throat, headache, wheezing, abdominal pain, diarrhea, dysuria, urinary frequency, leg swelling.  Past Medical History:  Diagnosis Date  . Blood transfusion without reported diagnosis   . Cardiomyopathy (HCC)   . GERD (gastroesophageal reflux disease)    Occasional  . H/O ETOH abuse   . MI (myocardial infarction) (HCC)   . Opiate abuse, episodic [facility](HCC)     Patient Active Problem List   Diagnosis Date Noted  . Dysuria 10/15/2014  . Routine general medical examination at a health care facility 06/18/2014  . Soreness breast 06/18/2014  . Vaginal discharge 06/10/2014  . Anxiety 05/19/2014  . Tachycardia 05/19/2014  . Low back pain 05/19/2014    Past Surgical History:  Procedure Laterality Date  . DILATION AND CURETTAGE OF UTERUS     August 2011  . DILATION  AND CURETTAGE OF UTERUS    . DILATION AND CURETTAGE OF UTERUS  11/2016    OB History    Gravida Para Term Preterm AB Living   0 0 0 0 0     SAB TAB Ectopic Multiple Live Births   0 0 0           Home Medications    Prior to Admission medications   Medication Sig Start Date End Date Taking? Authorizing Provider  etonogestrel-ethinyl estradiol (NUVARING) 0.12-0.015 MG/24HR vaginal ring Place 1 each vaginally every 28 (twenty-eight) days. Insert vaginally and leave in place for 3 consecutive weeks, then remove for 1 week.    [provider]  ibuprofen (ADVIL,MOTRIN) 600 MG tablet Take 1 tablet (600 mg total) by mouth every 6 (six) hours as needed. 06/02/15   Hedges, Tinnie GensJeffrey, PA-C  ibuprofen (ADVIL,MOTRIN) 600 MG tablet Take 1 tablet (600 mg total) by mouth every 6 (six) hours as needed. 06/09/15   Teressa LowerPickering, Vrinda, NP  ondansetron (ZOFRAN ODT) 4 MG disintegrating tablet Take 1 tablet (4 mg total) by mouth every 8 (eight) hours as needed for nausea or vomiting. 04/10/17   Pricilla LovelessGoldston, Scott, MD  QUEtiapine (SEROQUEL) 200 MG tablet Take 200 mg by mouth at bedtime.    [provider]  topiramate (TOPAMAX) 15 MG capsule Take 50 mg by mouth 3 (three) times daily.    [provider]    Family History Family History  Family history unknown: Yes  Problem Relation Age of Onset  .  Heart disease Mother   . Arthritis Maternal Grandmother   . Hypertension Maternal Grandmother   . Ovarian cancer Maternal Grandmother   . Lung cancer Maternal Grandmother     Social History Social History   Tobacco Use  . Smoking status: Current Every Day Smoker    Packs/day: 1.00    Years: 10.00    Pack years: 10.00    Types: Cigarettes  . Smokeless tobacco: Never Used  Substance Use Topics  . Alcohol use: No    Comment: etoh abuse (recovering since 10/02/2012)  . Drug use: Yes    Comment: opiate abuse ( recovering since 10/02/2012), heroin     Allergies   Latex   Review of  Systems Review of Systems  Constitutional: Positive for chills, fatigue and fever.  HENT: Positive for congestion, rhinorrhea, sinus pressure and sinus pain. Negative for ear pain, sore throat and trouble swallowing.   Respiratory: Positive for cough and shortness of breath (with cough). Negative for wheezing.   Cardiovascular: Positive for chest pain (soreness with cough).  Gastrointestinal: Positive for nausea and vomiting. Negative for abdominal pain and diarrhea.  Genitourinary: Negative for difficulty urinating, dysuria, flank pain and frequency.  Musculoskeletal: Negative for myalgias.  Skin: Negative for rash.  Neurological: Negative for headaches.  Psychiatric/Behavioral: Negative for agitation.     Physical Exam Updated Vital Signs BP (!) 117/105   Pulse 100   Temp 98.3 F (36.8 C) (Oral)   Resp 18   Ht 5\' 3"  (1.6 m)   Wt 54.4 kg (120 lb)   LMP 08/07/2017   SpO2 98%   BMI 21.26 kg/m   Physical Exam  Constitutional: She is oriented to person, place, and time. She appears well-developed and well-nourished. No distress.  HENT:  Head: Normocephalic and atraumatic.  Mucous membranes moist. Posterior oropharynx mildly erythematous. No tonsillar swelling or exudate. Uvula midline. Airway patent. Mild TTP of maxillary and frontal sinus bilaterally. Clear rhinorrhea in the nasal cavity.   Eyes: Conjunctivae are normal. Pupils are equal, round, and reactive to light. Right eye exhibits no discharge. Left eye exhibits no discharge.  Neck: Normal range of motion. Neck supple.  Cardiovascular: Normal rate, regular rhythm and intact distal pulses. Exam reveals no friction rub.  No murmur heard. Pulmonary/Chest: Effort normal and breath sounds normal. No stridor. No respiratory distress. She has no wheezes. She has no rales.  Abdominal: Soft. Bowel sounds are normal. There is no tenderness. There is no guarding.  Lymphadenopathy:    She has cervical adenopathy.  Neurological: She  is alert and oriented to person, place, and time. Coordination normal.  Skin: Skin is warm and dry. She is not diaphoretic.  Psychiatric: She has a normal mood and affect. Her behavior is normal.  Nursing note and vitals reviewed.    ED Treatments / Results  Labs (all labs ordered are listed, but only abnormal results are displayed) Labs Reviewed - No data to display  EKG  EKG Interpretation None       Radiology Dg Chest 2 View  Result Date: 08/08/2017 CLINICAL DATA:  Cough and congestion EXAM: CHEST  2 VIEW COMPARISON:  01/07/2015 FINDINGS: The heart size and mediastinal contours are within normal limits. Both lungs are clear. The visualized skeletal structures are unremarkable. IMPRESSION: No active cardiopulmonary disease. Electronically Signed   By: Alcide Clever M.D.   On: 08/08/2017 13:18    Procedures Procedures (including critical care time)  Medications Ordered in ED Medications - No data to display  Initial Impression / Assessment and Plan / ED Course  I have reviewed the triage vital signs and the nursing notes.  Pertinent labs & imaging results that were available during my care of the patient were reviewed by me and considered in my medical decision making (see chart for details).    Pt CXR negative for acute infiltrate. Patients symptoms are consistent with URI, likely viral etiology. Discussed that antibiotics are not indicated for viral infections. Pt will be discharged with symptomatic treatment.  Verbalizes understanding and is agreeable with plan. Pt is hemodynamically stable & in NAD prior to dc.   Final Clinical Impressions(s) / ED Diagnoses   Final diagnoses:  Viral URI with cough    ED Discharge Orders        Ordered    benzonatate (TESSALON) 100 MG capsule  Every 8 hours     08/08/17 1535    guaiFENesin-codeine (ROBITUSSIN AC) 100-10 MG/5ML syrup  3 times daily PRN     08/08/17 1535       Kellie Shropshire, PA-C 08/08/17 1949      Nira Conn, MD 08/10/17 1257

## 2017-08-08 NOTE — Discharge Instructions (Signed)
Chest x-ray was reassuring. No pneumonia.   Please drink plenty of fluids and get rest.  I have written you a prescription for cough medicine with codeine in it, it can make you drowsy so please do not drive, work or drink alcohol while taking it.  Please also take tylenol as needed for fever/chills.  Return to the emergency department if you have worsening cough with trouble breathing, vomiting that will not stop or have any new or worsening symptoms.

## 2017-08-08 NOTE — ED Triage Notes (Signed)
Cough x 2 days

## 2021-01-05 ENCOUNTER — Emergency Department (HOSPITAL_COMMUNITY)
Admission: EM | Admit: 2021-01-05 | Discharge: 2021-01-06 | Disposition: A | Discharge status: Home / Self Care | Payer: Medicaid Other | Attending: Emergency Medicine | Admitting: Emergency Medicine

## 2021-01-05 ENCOUNTER — Encounter (HOSPITAL_COMMUNITY): Payer: Self-pay

## 2021-01-05 ENCOUNTER — Other Ambulatory Visit: Payer: Self-pay

## 2021-01-05 DIAGNOSIS — N83201 Unspecified ovarian cyst, right side: Secondary | ICD-10-CM | POA: Diagnosis not present

## 2021-01-05 DIAGNOSIS — R5383 Other fatigue: Secondary | ICD-10-CM | POA: Diagnosis not present

## 2021-01-05 DIAGNOSIS — F17210 Nicotine dependence, cigarettes, uncomplicated: Secondary | ICD-10-CM | POA: Diagnosis not present

## 2021-01-05 DIAGNOSIS — Z91040 Latex allergy status: Secondary | ICD-10-CM | POA: Diagnosis not present

## 2021-01-05 DIAGNOSIS — R102 Pelvic and perineal pain: Secondary | ICD-10-CM | POA: Diagnosis present

## 2021-01-05 DIAGNOSIS — N83291 Other ovarian cyst, right side: Secondary | ICD-10-CM

## 2021-01-05 LAB — COMPREHENSIVE METABOLIC PANEL
ALT: 8 U/L (ref 0–44)
AST: 16 U/L (ref 15–41)
Albumin: 4.8 g/dL (ref 3.5–5.0)
Alkaline Phosphatase: 39 U/L (ref 38–126)
Anion gap: 8 (ref 5–15)
BUN: 7 mg/dL (ref 6–20)
CO2: 23 mmol/L (ref 22–32)
Calcium: 8.8 mg/dL — ABNORMAL LOW (ref 8.9–10.3)
Chloride: 111 mmol/L (ref 98–111)
Creatinine, Ser: 0.49 mg/dL (ref 0.44–1.00)
GFR, Estimated: >60 mL/min (ref >60)
Glucose, Bld: 96 mg/dL (ref 70–99)
Potassium: 3.7 mmol/L (ref 3.5–5.1)
Sodium: 142 mmol/L (ref 135–145)
Total Bilirubin: 0.3 mg/dL (ref 0.3–1.2)
Total Protein: 7.6 g/dL (ref 6.5–8.1)

## 2021-01-05 LAB — URINALYSIS, ROUTINE W REFLEX MICROSCOPIC
Bacteria, UA: NONE SEEN
Bilirubin Urine: NEGATIVE
Glucose, UA: NEGATIVE mg/dL
Ketones, ur: NEGATIVE mg/dL
Leukocytes,Ua: NEGATIVE
Nitrite: NEGATIVE
Protein, ur: NEGATIVE mg/dL
Specific Gravity, Urine: 1.008 (ref 1.005–1.030)
pH: 6 (ref 5.0–8.0)

## 2021-01-05 LAB — CBC WITH DIFFERENTIAL/PLATELET
Abs Immature Granulocytes: 0.04 10*3/uL (ref 0.00–0.07)
Basophils Absolute: 0.1 10*3/uL (ref 0.0–0.1)
Basophils Relative: 1 %
Eosinophils Absolute: 0 10*3/uL (ref 0.0–0.5)
Eosinophils Relative: 0 %
HCT: 42.7 % (ref 36.0–46.0)
Hemoglobin: 14.3 g/dL (ref 12.0–15.0)
Immature Granulocytes: 1 %
Lymphocytes Relative: 32 %
Lymphs Abs: 2.3 10*3/uL (ref 0.7–4.0)
MCH: 32.4 pg (ref 26.0–34.0)
MCHC: 33.5 g/dL (ref 30.0–36.0)
MCV: 96.8 fL (ref 80.0–100.0)
Monocytes Absolute: 0.5 10*3/uL (ref 0.1–1.0)
Monocytes Relative: 7 %
Neutro Abs: 4.2 10*3/uL (ref 1.7–7.7)
Neutrophils Relative %: 59 %
Platelets: 289 10*3/uL (ref 150–400)
RBC: 4.41 MIL/uL (ref 3.87–5.11)
RDW: 12.8 % (ref 11.5–15.5)
WBC: 7.2 10*3/uL (ref 4.0–10.5)
nRBC: 0 % (ref 0.0–0.2)

## 2021-01-05 LAB — I-STAT BETA HCG BLOOD, ED (MC, WL, AP ONLY): I-stat hCG, quantitative: <5 m[IU]/mL (ref <5)

## 2021-01-05 LAB — LIPASE, BLOOD: Lipase: 34 U/L (ref 11–51)

## 2021-01-05 NOTE — ED Provider Notes (Signed)
Emergency Medicine Provider Triage Evaluation Note  Sandra [name] , a 35 y.o. female  was evaluated in triage.  Pt complains of weight loss, fatigue, nv. Luq abd pain. Denies hematemesis or hematochezia. She further reports vaginal bleeding since she had her son 19 months ago.  Review of Systems  Positive: Abd pain, nv, weight loss, fatigue Negative: Hematemesis, hematochezia  Physical Exam  BP (!) 141/104 (BP Location: Right Arm)   Pulse (!) 118   Temp 99.4 F (37.4 C) (Oral)   Resp 16   Ht 5\' 3"  (1.6 m)   Wt 45.4 kg   SpO2 100%   BMI 17.71 kg/m  Gen:   Awake, no distress   Resp:  Normal effort  MSK:   Moves extremities without difficulty  Other:    Medical Decision Making  Medically screening exam initiated at 9:05 PM.  Appropriate orders placed.  Sandra [name] was informed that the remainder of the evaluation will be completed by another provider, this initial triage assessment does not replace that evaluation, and the importance of remaining in the ED until their evaluation is complete.     Sandra [name] 01/05/21 2108    2109, MD 01/10/21 2155

## 2021-01-05 NOTE — ED Triage Notes (Signed)
Pt c/o increased vaginal bleeding today where she soaked through a tampon in an hour. Also c/o n/v and a weight loss of 40lbs in 6 months.

## 2021-01-06 ENCOUNTER — Emergency Department (HOSPITAL_COMMUNITY): Payer: Medicaid Other

## 2021-01-06 LAB — TSH: TSH: 3.263 u[IU]/mL (ref 0.350–4.500)

## 2021-01-06 LAB — T4, FREE: Free T4: 0.74 ng/dL (ref 0.61–1.12)

## 2021-01-06 NOTE — ED Notes (Signed)
Pinpad in room unable to capture patients ED discharge signature.

## 2021-01-06 NOTE — Discharge Instructions (Addendum)
Thank you for allowing me to care for you today in the Emergency Department.   Please follow-up with OB/GYN regarding the 2 complex cystic structures on your right ovary.  The radiologist recommended repeat imaging in 6 to 12 weeks.  Please continue to follow-up with urology regarding the hydronephrosis/swelling of your kidneys.  Return to the emergency department if you become unable to urinate, if you develop severe, uncontrollable abdominal pain, if you have uncontrollable vomiting stop making urine, or have other new, concerning symptoms.

## 2021-01-06 NOTE — ED Provider Notes (Signed)
[facility]-EMERGENCY DEPT Provider Note   CSN: [id number] Arrival date & time: 01/05/21  2045     History Chief Complaint  Patient presents with   Vaginal Bleeding    Sandra [name] is a 35 y.o. female with a remote history of opioid abuse in remission for 5+ years, anxiety who presents the emergency department with a chief complaint of fatigue.  The patient reports that over the last 6 months that she has become increasingly fatigue and very weak.  She reports almost daily vomiting and is persistently nauseated.  She has had an unintentional 40 pound weight loss over the last 6 months.  She has been having night sweats.  She has a 13-month-old son at home and reports that she feels so bad that she can barely play with him.  She states "I feel like I cannot keep going on with how terrible I feel."  He also sleeps with her and she has had to bundle him up due to having to keep the air down so low due to the night sweats.  Her symptoms have not worsened this week, but this is the first opportunity since his birth that she states that she has had a chance to try and take care of herself.  She also reports that she has had more lightheadedness.  No syncope or dizziness.  She sometimes feels more constipated and feels as if she has more frequent urination.  She denies decreased urinary output, urinary dribbling or hesitancy.  No dysuria or gross hematuria.  She has been having some back pain, but denies diarrhea, abdominal pain.  She does note that she has been having intermittent fevers.  No known sick contacts and she has not used IV drugs in more than 5 years.  She does also have an IUD in place since the birth of her son and has been having almost persistent bleeding, mostly spotting, but she does note that tonight she bled through a tampon in less than an hour, which is new.  She denies vaginal itching, discharge, pelvic pain, abdominal distention, numbness, weakness,  visual changes, chest pain, shortness of breath.  She was seen for the same at another ER earlier this week and had a CT stone study that showed bilateral hydronephrosis.  She followed up with urology who sent her urine out for culture and was told that she may be scheduled for a cystoscopy at some point in the future depending on the culture results.  She also has an upcoming appointment with GI on June 13.  She states that her grandmother had an unknown type of GYN cancer.  She is unsure of any history of thyroid disorders in her family.  The history is provided by the patient and medical records. No language interpreter was used.      Past Medical History:  Diagnosis Date   Blood transfusion without reported diagnosis    Cardiomyopathy (HCC)    GERD (gastroesophageal reflux disease)    Occasional   H/O ETOH abuse    MI (myocardial infarction) (HCC)    Opiate abuse, episodic (HCC)     Patient Active Problem List   Diagnosis Date Noted   Dysuria 10/15/2014   Routine general medical examination at a health care facility 06/18/2014   Soreness breast 06/18/2014   Vaginal discharge 06/10/2014   Anxiety 05/19/2014   Tachycardia 05/19/2014   Low back pain 05/19/2014    Past Surgical History:  Procedure Laterality Date   DILATION AND  CURETTAGE OF UTERUS     August 2011   DILATION AND CURETTAGE OF UTERUS     DILATION AND CURETTAGE OF UTERUS  11/2016     OB History     Gravida  0   Para  0   Term  0   Preterm  0   AB  0   Living         SAB  0   IAB  0   Ectopic  0   Multiple      Live Births              Family History  Family history unknown: Yes  Problem Relation Age of Onset   Family history unknown: Yes   Heart disease Mother    Arthritis Maternal Grandmother    Hypertension Maternal Grandmother    Ovarian cancer Maternal Grandmother    Lung cancer Maternal Grandmother     Social History   Tobacco Use   Smoking status: Every Day     Packs/day: 1.00    Years: 10.00    Pack years: 10.00    Types: Cigarettes   Smokeless tobacco: Never  Substance Use Topics   Alcohol use: No    Comment: etoh abuse (recovering since 10/02/2012)   Drug use: Yes    Comment: opiate abuse ( recovering since 10/02/2012), heroin    Home Medications Prior to Admission medications   Medication Sig Start Date End Date Taking? Authorizing Provider  benzonatate (TESSALON) 100 MG capsule Take 1 capsule (100 mg total) by mouth every 8 (eight) hours. 08/08/17   Kellie ShropshireShrosbree, Emily J, PA-C  etonogestrel-ethinyl estradiol (NUVARING) 0.12-0.015 MG/24HR vaginal ring Place 1 each vaginally every 28 (twenty-eight) days. Insert vaginally and leave in place for 3 consecutive weeks, then remove for 1 week.    [provider]  guaiFENesin-codeine (ROBITUSSIN AC) 100-10 MG/5ML syrup Take 5 mLs by mouth 3 (three) times daily as needed for cough. 08/08/17   Kellie ShropshireShrosbree, Emily J, PA-C  ibuprofen (ADVIL,MOTRIN) 600 MG tablet Take 1 tablet (600 mg total) by mouth every 6 (six) hours as needed. 06/02/15   Hedges, Tinnie GensJeffrey, PA-C  ibuprofen (ADVIL,MOTRIN) 600 MG tablet Take 1 tablet (600 mg total) by mouth every 6 (six) hours as needed. 06/09/15   Teressa LowerPickering, Vrinda, NP  ondansetron (ZOFRAN ODT) 4 MG disintegrating tablet Take 1 tablet (4 mg total) by mouth every 8 (eight) hours as needed for nausea or vomiting. 04/10/17   Pricilla LovelessGoldston, Scott, MD  QUEtiapine (SEROQUEL) 200 MG tablet Take 200 mg by mouth at bedtime.    [provider]  topiramate (TOPAMAX) 15 MG capsule Take 50 mg by mouth 3 (three) times daily.    [provider]  atenolol (TENORMIN) 25 MG tablet Take 25 mg by mouth daily.  03/26/15  [provider]    Allergies    Latex  Review of Systems   Review of Systems  Constitutional:  Positive for fatigue and fever. Negative for activity change.       Night sweats  HENT:  Negative for congestion.   Respiratory:  Negative for shortness of  breath.   Cardiovascular:  Negative for chest pain.  Gastrointestinal:  Positive for constipation, nausea and vomiting. Negative for abdominal pain and diarrhea.  Genitourinary:  Positive for frequency. Negative for dysuria.  Musculoskeletal:  Positive for back pain. Negative for myalgias, neck pain and neck stiffness.  Skin:  Negative for rash.  Allergic/Immunologic: Negative for immunocompromised state.  Neurological:  Positive for light-headedness. Negative for dizziness, seizures, syncope, weakness and headaches.  Psychiatric/Behavioral:  Negative for confusion.    Physical Exam Updated Vital Signs BP 112/82   Pulse 80   Temp 99.4 F (37.4 C) (Oral)   Resp 16   Ht 5\' 3"  (1.6 m)   Wt 45.4 kg   SpO2 97%   BMI 17.71 kg/m   Physical Exam Vitals and nursing note reviewed.  Constitutional:      General: She is not in acute distress.    Appearance: She is not ill-appearing, toxic-appearing or diaphoretic.     Comments: Thin female.  No acute distress.  HENT:     Head: Normocephalic.  Eyes:     Conjunctiva/sclera: Conjunctivae normal.  Cardiovascular:     Rate and Rhythm: Normal rate and regular rhythm.     Heart sounds: No murmur heard.   No friction rub. No gallop.  Pulmonary:     Effort: Pulmonary effort is normal. No respiratory distress.  Abdominal:     General: There is no distension.     Palpations: Abdomen is soft.     Tenderness: There is abdominal tenderness.     Comments: Tender to palpation in the left pelvic region.  No palpable masses.  No rebound or guarding.  No right-sided pelvic tenderness.  She does CVA tenderness on the left.  No right-sided CVA tenderness.  Abdomen is soft and nondistended.  Musculoskeletal:     Cervical back: Neck supple.  Skin:    General: Skin is warm.     Coloration: Skin is not jaundiced.     Findings: No rash.  Neurological:     Mental Status: She is alert.  Psychiatric:        Behavior: Behavior normal.    ED Results /  Procedures / Treatments   Labs (all labs ordered are listed, but only abnormal results are displayed) Labs Reviewed  COMPREHENSIVE METABOLIC PANEL - Abnormal; Notable for the following components:      Result Value   Calcium 8.8 (*)    All other components within normal limits  URINALYSIS, ROUTINE W REFLEX MICROSCOPIC - Abnormal; Notable for the following components:   Color, Urine STRAW (*)    Hgb urine dipstick SMALL (*)    All other components within normal limits  CBC WITH DIFFERENTIAL/PLATELET  LIPASE, BLOOD  TSH  T3, FREE  T4, FREE  I-STAT BETA HCG BLOOD, ED ([facility], [facility], [facility] ONLY)    EKG None  Radiology PELVIC COMPLETE W TRANSVAGINAL AND TORSION R/O  Result Date: 01/06/2021 CLINICAL DATA:  Initial evaluation for acute pelvic pain. EXAM: TRANSABDOMINAL AND TRANSVAGINAL ULTRASOUND OF PELVIS DOPPLER ULTRASOUND OF OVARIES TECHNIQUE: Both transabdominal and transvaginal ultrasound examinations of the pelvis were performed. Transabdominal technique was performed for global imaging of the pelvis including uterus, ovaries, adnexal regions, and pelvic cul-de-sac. It was necessary to proceed with endovaginal exam following the transabdominal exam to visualize the uterus, endometrium, and ovaries. Color and duplex Doppler ultrasound was utilized to evaluate blood flow to the ovaries. COMPARISON:  None. FINDINGS: Uterus Measurements: 6.4 x 3.1 x 4.8 cm = volume: 48.8 mL. Uterus is anteverted. No discrete fibroid or other mass. Endometrium Thickness: 3 mm. IUD in appropriate position within the endometrial cavity. Few scattered echogenic foci involving the endometrial complex suspected reflect small calcifications, of doubtful clinical significance. Right ovary Measurements: 4.2 x 2.9 x 3.9 cm = volume: 24.6 mL. 1.8 x 1.2 x 2.4 cm complex hypoechoic cystic lesion with  peripherally increased vascularity within the right ovary, either a hemorrhagic cyst or degenerating corpus luteal cyst. Additional  complex cystic lesion measuring 2.0 x 1.6 x 1.8 cm felt to be most consistent with a hemorrhagic cyst with internal retractile clot. Left ovary Measurements: 3.2 x 2.6 x 2.0 cm = volume: 8.8 mL. Normal appearance/no adnexal mass. Pulsed Doppler evaluation of both ovaries demonstrates normal low-resistance arterial and venous waveforms. Other findings Trace free fluid seen within the pelvis. IMPRESSION: 1. Two complex right ovarian cysts measuring up to 2.4 cm as above, favored to reflect hemorrhagic cysts and/or degenerating corpus luteal cyst. While these are almost certainly benign, a short interval follow-up ultrasound in 6-12 weeks could be performed for further evaluation as clinically warranted. 2. No evidence for ovarian torsion or other acute abnormality. 3. IUD in appropriate position within the endometrial cavity. Electronically Signed   By: Rise Mu M.D.   On: 01/06/2021 01:32    Procedures Procedures   Medications Ordered in ED Medications - No data to display  ED Course  I have reviewed the triage vital signs and the nursing notes.  Pertinent labs & imaging results that were available during my care of the patient were reviewed by me and considered in my medical decision making (see chart for details).    MDM Rules/Calculators/A&P                           35 year old female with a remote history of opioid abuse in remission for 5+ years, anxiety who presents the emergency department with a 66-month history of fatigue, generalized weakness, 40 pound weight loss, night sweats, daily nausea and vomiting.  She was found to have bilateral hydronephrosis on a CT stone study earlier this week.  No history of kidney stones.  Tachycardic on arrival into the ER.  Afebrile.  Vital signs are otherwise unremarkable.  Tachycardia has since resolved spontaneously.  Given her symptoms, considered hyperthyroidism.  However, TSH level is within normal limits.  Free T3 and free T4 are  pending. Valley derangements.  CBC is unremarkable.  Pregnancy test is negative.  UA with small hemoglobinuria.  No evidence of infection.  Hemoglobin is within normal limits.  However, given her symptoms and unknown allergy of hydronephrosis, questioned pelvic etiology.  Pelvic ultrasound with 2 complex appearing right ovarian cyst that are measuring up to 2.4 cm.  Thought to be hemorrhagic cyst or degenerating corpus luteal cyst.  Thought to be benign, but interval follow-up in 6 to 12 weeks could be warranted.  IUD is in appropriate position.  Discussed findings with the patient.  She will plan on following up with OB/GYN.  She has an upcoming follow-up appointment with GI and is following with urology.  Doubt bowel obstruction, urinary retention, sepsis, pyelonephritis, cholecystitis, pancreatitis.  She will require further work-up and evaluation in the outpatient care setting.  She is hemodynamically stable no acute distress.  Safer discharge home with outpatient follow-up as discussed.  Final Clinical Impression(s) / ED Diagnoses Final diagnoses:  Pelvic pain  Complex cyst of right ovary    Rx / DC Orders ED Discharge Orders     None        Barkley Boards, PA-C 01/06/21 0301    Long, Arlyss Repress, MD 01/07/21 [phone]

## 2021-01-07 LAB — T3, FREE: T3, Free: 3.1 pg/mL (ref 2.0–4.4)

## 2022-10-29 ENCOUNTER — Ambulatory Visit: Payer: Medicaid Other | Admitting: Physician Assistant

## 2022-10-29 ENCOUNTER — Other Ambulatory Visit (HOSPITAL_COMMUNITY)
Admission: RE | Admit: 2022-10-29 | Discharge: 2022-10-29 | Disposition: A | Discharge status: Home / Self Care | Payer: Medicaid Other | Source: Ambulatory Visit | Attending: Physician Assistant | Admitting: Physician Assistant

## 2022-10-29 ENCOUNTER — Encounter: Payer: Self-pay | Admitting: Physician Assistant

## 2022-10-29 VITALS — BP 118/82 | HR 112 | Ht 63.0 in | Wt 119.0 lb

## 2022-10-29 DIAGNOSIS — B3731 Acute candidiasis of vulva and vagina: Secondary | ICD-10-CM

## 2022-10-29 DIAGNOSIS — F1010 Alcohol abuse, uncomplicated: Secondary | ICD-10-CM | POA: Diagnosis not present

## 2022-10-29 DIAGNOSIS — F5104 Psychophysiologic insomnia: Secondary | ICD-10-CM | POA: Diagnosis not present

## 2022-10-29 DIAGNOSIS — F1110 Opioid abuse, uncomplicated: Secondary | ICD-10-CM | POA: Diagnosis not present

## 2022-10-29 DIAGNOSIS — F411 Generalized anxiety disorder: Secondary | ICD-10-CM | POA: Diagnosis not present

## 2022-10-29 DIAGNOSIS — Z720 Tobacco use: Secondary | ICD-10-CM

## 2022-10-29 DIAGNOSIS — N760 Acute vaginitis: Secondary | ICD-10-CM

## 2022-10-29 DIAGNOSIS — B9689 Other specified bacterial agents as the cause of diseases classified elsewhere: Secondary | ICD-10-CM

## 2022-10-29 DIAGNOSIS — F17210 Nicotine dependence, cigarettes, uncomplicated: Secondary | ICD-10-CM

## 2022-10-29 MED ORDER — MIRTAZAPINE 30 MG PO TABS: 30.0000 mg | ORAL_TABLET | Freq: Every day | ORAL | 1 refills | Status: AC

## 2022-10-29 MED ORDER — NICOTINE 14 MG/24HR TD PT24: 14.0000 mg | MEDICATED_PATCH | Freq: Every day | TRANSDERMAL | 1 refills | Status: AC

## 2022-10-29 MED ORDER — SERTRALINE HCL 50 MG PO TABS: 50.0000 mg | ORAL_TABLET | Freq: Every day | ORAL | 1 refills | Status: AC

## 2022-10-29 MED ORDER — METRONIDAZOLE 500 MG PO TABS: 500.0000 mg | ORAL_TABLET | Freq: Two times a day (BID) | ORAL | 0 refills | Status: AC

## 2022-10-29 MED ORDER — BUSPIRONE HCL 10 MG PO TABS: 10.0000 mg | ORAL_TABLET | Freq: Three times a day (TID) | ORAL | 0 refills | Status: AC

## 2022-10-29 NOTE — Progress Notes (Signed)
New Patient Office Visit  Subjective    Patient ID: Beda Rabuck, female    DOB: Aug 25, 1985  Age: 37 y.o. MRN: [id number]  CC:  Chief Complaint  Patient presents with   Medication Refill    Leaving in 8 days, [facility]   Vaginal Discharge    HPI Nadija Dhaliwal states that she is currently being treated for substance abuse at [facility] residential treatment center, states that she arrived 3/11  States that she is planning going to family services in [facility], will be leaving [facility] in approximately 8 days.  States that she continues to have elevated anxiety, states that she does not feel the BuSpar is offering much relief.  States sleep is good using the Remeron.  States that she previously took Prozac as a teenager but does not remember if it was effective.  States that she has been having a milky, sometimes yellowish discharge for the past few days, endorses fishy odor, denies burning, itching, abdominal pain, low back pain.     Outpatient Encounter Medications as of 10/29/2022  Medication Sig   metroNIDAZOLE (FLAGYL) 500 MG tablet Take 1 tablet (500 mg total) by mouth 2 (two) times daily for 7 days.   sertraline (ZOLOFT) 50 MG tablet Take 1 tablet (50 mg total) by mouth daily.   [DISCONTINUED] mirtazapine (REMERON) 30 MG tablet Take 30 mg by mouth at bedtime.   busPIRone (BUSPAR) 10 MG tablet Take 1 tablet (10 mg total) by mouth 3 (three) times daily.   mirtazapine (REMERON) 30 MG tablet Take 1 tablet (30 mg total) by mouth at bedtime.   naloxone (NARCAN) nasal spray 4 mg/0.1 mL Place 1 spray into the nose once. (Patient not taking: Reported on 10/29/2022)   nicotine (NICODERM CQ - DOSED IN MG/24 HOURS) 14 mg/24hr patch Place 1 patch (14 mg total) onto the skin daily.   [DISCONTINUED] atenolol (TENORMIN) 25 MG tablet Take 25 mg by mouth daily.   [DISCONTINUED] benzonatate (TESSALON) 100 MG capsule Take 1 capsule (100 mg total) by mouth every 8  (eight) hours.   [DISCONTINUED] busPIRone (BUSPAR) 10 MG tablet Take 10 mg by mouth 3 (three) times daily. (Patient not taking: Reported on 10/29/2022)   [DISCONTINUED] etonogestrel-ethinyl estradiol (NUVARING) 0.12-0.015 MG/24HR vaginal ring Place 1 each vaginally every 28 (twenty-eight) days. Insert vaginally and leave in place for 3 consecutive weeks, then remove for 1 week. (Patient not taking: Reported on 10/29/2022)   [DISCONTINUED] guaiFENesin-codeine (ROBITUSSIN AC) 100-10 MG/5ML syrup Take 5 mLs by mouth 3 (three) times daily as needed for cough.   [DISCONTINUED] ibuprofen (ADVIL,MOTRIN) 600 MG tablet Take 1 tablet (600 mg total) by mouth every 6 (six) hours as needed.   [DISCONTINUED] ibuprofen (ADVIL,MOTRIN) 600 MG tablet Take 1 tablet (600 mg total) by mouth every 6 (six) hours as needed.   [DISCONTINUED] levonorgestrel (MIRENA, 52 MG,) 20 MCG/DAY IUD 1 each by Intrauterine route once. (Patient not taking: Reported on 10/29/2022)   [DISCONTINUED] nicotine (NICODERM CQ - DOSED IN MG/24 HOURS) 14 mg/24hr patch Place 14 mg onto the skin daily. (Patient not taking: Reported on 10/29/2022)   [DISCONTINUED] ondansetron (ZOFRAN ODT) 4 MG disintegrating tablet Take 1 tablet (4 mg total) by mouth every 8 (eight) hours as needed for nausea or vomiting.   [DISCONTINUED] QUEtiapine (SEROQUEL) 200 MG tablet Take 200 mg by mouth at bedtime.   [DISCONTINUED] topiramate (TOPAMAX) 15 MG capsule Take 50 mg by mouth 3 (three) times daily.   No facility-administered encounter  medications on file as of 10/29/2022.    Past Medical History:  Diagnosis Date   Blood transfusion without reported diagnosis    Cardiomyopathy    GERD (gastroesophageal reflux disease)    Occasional   H/O ETOH abuse    MI (myocardial infarction)    Opiate abuse, episodic     Past Surgical History:  Procedure Laterality Date   DILATION AND CURETTAGE OF UTERUS     August 2011   DILATION AND CURETTAGE OF UTERUS     DILATION AND  CURETTAGE OF UTERUS  11/2016    Family History  Family history unknown: Yes  Problem Relation Age of Onset   Family history unknown: Yes   Heart disease Mother    Arthritis Maternal Grandmother    Hypertension Maternal Grandmother    Ovarian cancer Maternal Grandmother    Lung cancer Maternal Grandmother     Social History   Socioeconomic History   Marital status: Married    Spouse name: Not on file   Number of children: 1   Years of education: 15   Highest education level: Not on file  Occupational History   Occupation: Metallurgist: [employer]  Tobacco Use   Smoking status: Every Day    Packs/day: 1.00    Years: 10.00    Additional pack years: 0.00    Total pack years: 10.00    Types: Cigarettes   Smokeless tobacco: Never  Substance and Sexual Activity   Alcohol use: No    Comment: etoh abuse (recovering since 10/02/2012)   Drug use: Yes    Comment: opiate abuse ( recovering since 10/02/2012), heroin   Sexual activity: Yes    Partners: Male    Birth control/protection: Other-see comments    Comment: nuvaring  Other Topics Concern   Not on file  Social History Narrative   ** Merged History Encounter **       ** Data from: 10/15/14 Enc Dept: [facility]       ** Data from: 06/18/14 Enc Dept: [facility]   Born and raised in [place], [place]. Lives in an apartment with husband and daughter. One cat as pet. 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   Denies any religious beliefs that would effect healthcare.    Social Determinants of Health   Financial Resource Strain: Not on file  Food Insecurity: Not on file  Transportation Needs: Not on file  Physical Activity: Not on file  Stress: Not on file  Social Connections: Not on file  Intimate Partner Violence: Not on file    Review of Systems  Constitutional:  Negative for chills and fever.  HENT: Negative.    Eyes: Negative.   Respiratory:  Negative for shortness of breath.    Cardiovascular:  Negative for chest pain.  Gastrointestinal:  Negative for abdominal pain, nausea and vomiting.  Genitourinary:  Negative for dysuria and frequency.  Musculoskeletal:  Negative for back pain.  Skin: Negative.   Neurological: Negative.   Endo/Heme/Allergies: Negative.   Psychiatric/Behavioral:  Negative for depression. The patient is nervous/anxious. The patient does not have insomnia.         Objective    BP 118/82 (BP Location: Left Arm, Patient Position: Sitting, Cuff Size: Normal)   Pulse (!) 112   Ht 5\' 3"  (1.6 m)   Wt 119 lb (54 kg)   LMP 10/26/2022   SpO2 98%   BMI 21.08 kg/m   Physical Exam Vitals  and nursing note reviewed.  Constitutional:      Appearance: Normal appearance.  HENT:     Head: Normocephalic and atraumatic.     Right Ear: External ear normal.     Left Ear: External ear normal.     Nose: Nose normal.     Mouth/Throat:     Mouth: Mucous membranes are moist.     Pharynx: Oropharynx is clear.  Eyes:     Extraocular Movements: Extraocular movements intact.     Conjunctiva/sclera: Conjunctivae normal.     Pupils: Pupils are equal, round, and reactive to light.  Cardiovascular:     Rate and Rhythm: Regular rhythm. Tachycardia present.     Pulses: Normal pulses.     Heart sounds: Normal heart sounds.  Pulmonary:     Effort: Pulmonary effort is normal.     Breath sounds: Normal breath sounds.  Abdominal:     Tenderness: There is no abdominal tenderness. There is no right CVA tenderness or left CVA tenderness.  Musculoskeletal:        General: Normal range of motion.     Cervical back: Normal range of motion and neck supple.  Skin:    General: Skin is warm and dry.  Neurological:     General: No focal deficit present.     Mental Status: She is alert and oriented to person, place, and time.  Psychiatric:        Mood and Affect: Mood normal.        Behavior: Behavior normal.        Thought Content: Thought content normal.         Judgment: Judgment normal.         Assessment & Plan:   Problem List Items Addressed This Visit       Genitourinary   Bacterial vaginitis   Relevant Medications   metroNIDAZOLE (FLAGYL) 500 MG tablet   Other Relevant Orders   Cervicovaginal ancillary only     Other   GAD (generalized anxiety disorder) - Primary   Relevant Medications   mirtazapine (REMERON) 30 MG tablet   busPIRone (BUSPAR) 10 MG tablet   sertraline (ZOLOFT) 50 MG tablet   Psychophysiological insomnia   Relevant Medications   mirtazapine (REMERON) 30 MG tablet   Alcohol abuse   Heroin abuse   Tobacco abuse   Relevant Medications   nicotine (NICODERM CQ - DOSED IN MG/24 HOURS) 14 mg/24hr patch   1. GAD (generalized anxiety disorder) Trial Zoloft, continue BuSpar.  Patient strongly encouraged to follow-up with medical provider when she transitions to family services in [facility], or return to the mobile unit for follow-up.  Patient understands and agrees.   - busPIRone (BUSPAR) 10 MG tablet; Take 1 tablet (10 mg total) by mouth 3 (three) times daily.  Dispense: 90 tablet; Refill: 0 - sertraline (ZOLOFT) 50 MG tablet; Take 1 tablet (50 mg total) by mouth daily.  Dispense: 30 tablet; Refill: 1  2. Psychophysiological insomnia Continue current regimen - mirtazapine (REMERON) 30 MG tablet; Take 1 tablet (30 mg total) by mouth at bedtime.  Dispense: 30 tablet; Refill: 1  3. Bacterial vaginitis Will treat based on clinical presentation.  Patient education given on supportive care - Cervicovaginal ancillary only - metroNIDAZOLE (FLAGYL) 500 MG tablet; Take 1 tablet (500 mg total) by mouth 2 (two) times daily for 7 days.  Dispense: 14 tablet; Refill: 0  4. Alcohol abuse Currently in substance abuse treatment program  5. Opioid abuse  6. Heroin abuse   7. Tobacco abuse  - nicotine (NICODERM CQ - DOSED IN MG/24 HOURS) 14 mg/24hr patch; Place 1 patch (14 mg total) onto the skin daily.  Dispense: 28  patch; Refill: 1   I have reviewed the patient's medical history (PMH, PSH, Social History, Family History, Medications, and allergies) , and have been updated if relevant. I spent 30 minutes reviewing chart and  face to face time with patient.    Return if symptoms worsen or fail to improve.   Loraine Grip Mayers, PA-C

## 2022-10-29 NOTE — Patient Instructions (Signed)
To help with your anxiety, you are going to start Zoloft 50 mg once daily.  To help with your vaginal discharge, you are going to take metronidazole twice daily for 7 days.  We will call you with today's lab results.  Kennieth Rad, PA-C Physician Assistant [facility] Medicine [email or web address]  Bacterial Vaginosis  Bacterial vaginosis is an infection that occurs when the normal balance of bacteria in the vagina changes. This change is caused by an overgrowth of certain bacteria in the vagina. Bacterial vaginosis is the most common vaginal infection among females aged 74 to 85 years. This condition increases the risk of sexually transmitted infections (STIs). Treatment can help reduce this risk. Treatment is very important for pregnant women because this condition can cause babies to be born early (prematurely) or at a low birth weight. What are the causes? This condition is caused by an increase in harmful bacteria that are normally present in small amounts in the vagina. However, the exact reason this condition develops is not known. You cannot get bacterial vaginosis from toilet seats, bedding, swimming pools, or contact with objects around you. What increases the risk? The following factors may make you more likely to develop this condition: Having a new sexual partner or multiple sexual partners, or having unprotected sex. Douching. Having an intrauterine device (IUD). Smoking. Abusing drugs and alcohol. This may lead to riskier sexual behavior. Taking certain antibiotic medicines. Being pregnant. What are the signs or symptoms? Some women with this condition have no symptoms. Symptoms may include: Pearline Cables or white vaginal discharge. The discharge can be watery or foamy. A fish-like odor with discharge, especially after sex or during menstruation. Itching in and around the vagina. Burning or pain with urination. How is this  diagnosed? This condition is diagnosed based on: Your medical history. A physical exam of the vagina. Checking a sample of vaginal fluid for harmful bacteria or abnormal cells. How is this treated? This condition is treated with antibiotic medicines. These may be given as a pill, a vaginal cream, or a medicine that is put into the vagina (suppository). If the condition comes back after treatment, a second round of antibiotics may be needed. Follow these instructions at home: Medicines Take or apply over-the-counter and prescription medicines only as told by your health care provider. Take or apply your antibiotic medicine as told by your health care provider. Do not stop using the antibiotic even if you start to feel better. General instructions If you have a female sexual partner, tell her that you have a vaginal infection. She should follow up with her health care provider. If you have a female sexual partner, he does not need treatment. Avoid sexual activity until you finish treatment. Drink enough fluid to keep your urine pale yellow. Keep the area around your vagina and rectum clean. Wash the area daily with warm water. Wipe yourself from front to back after using the toilet. If you are breastfeeding, talk to your health care provider about continuing breastfeeding during treatment. Keep all follow-up visits. This is important. How is this prevented? Self-care Do not douche. Wash the outside of your vagina with warm water only. Wear cotton or cotton-lined underwear. Avoid wearing tight pants and pantyhose, especially during the summer. Safe sex Use protection when having sex. This includes: Using condoms. Using dental dams. This is a thin layer of a material made of latex or polyurethane that protects the mouth during oral sex. Limit the number of sexual partners. To help  prevent bacterial vaginosis, it is best to have sex with just one partner (monogamous relationship). Make sure  you and your sexual partner are tested for STIs. Drugs and alcohol Do not use any products that contain nicotine or tobacco. These products include cigarettes, chewing tobacco, and vaping devices, such as e-cigarettes. If you need help quitting, ask your health care provider. Do not use drugs. Do not drink alcohol if: Your health care provider tells you not to do this. You are pregnant, may be pregnant, or are planning to become pregnant. If you drink alcohol: Limit how much you have to 0-1 drink a day. Be aware of how much alcohol is in your drink. In the [place] one drink equals one 12 oz bottle of beer (355 mL), one 5 oz glass of wine (148 mL), or one 1 oz glass of hard liquor (44 mL). Where to find more information Centers for Disease Control and Prevention: http://www.wolf.info/ American Sexual Health Association (ASHA): www.ashastd.org U.S. Department of Health and Financial controller, Office on Women's Health: [place].tn Contact a health care provider if: Your symptoms do not improve, even after treatment. You have more discharge or pain when urinating. You have a fever or chills. You have pain in your abdomen or pelvis. You have pain during sex. You have vaginal bleeding between menstrual periods. Summary Bacterial vaginosis is a vaginal infection that occurs when the normal balance of bacteria in the vagina changes. It results from an overgrowth of certain bacteria. This condition increases the risk of sexually transmitted infections (STIs). Getting treated can help reduce this risk. Treatment is very important for pregnant women because this condition can cause babies to be born early (prematurely) or at low birth weight. This condition is treated with antibiotic medicines. These may be given as a pill, a vaginal cream, or a medicine that is put into the vagina (suppository). This information is not intended to replace advice given to you by your health care provider. Make sure you  discuss any questions you have with your health care provider. Document Revised: 01/14/2020 Document Reviewed: 01/14/2020 Elsevier Patient Education  [place].

## 2022-10-30 LAB — CERVICOVAGINAL ANCILLARY ONLY
Bacterial Vaginitis (gardnerella): POSITIVE — AB
Candida Glabrata: NEGATIVE
Candida Vaginitis: POSITIVE — AB
Chlamydia: NEGATIVE
Comment: NEGATIVE
Comment: NEGATIVE
Comment: NEGATIVE
Comment: NEGATIVE
Comment: NEGATIVE
Comment: NORMAL
Neisseria Gonorrhea: NEGATIVE
Trichomonas: NEGATIVE

## 2022-10-30 MED ORDER — FLUCONAZOLE 150 MG PO TABS: 150.0000 mg | ORAL_TABLET | Freq: Once | ORAL | 0 refills | Status: AC

## 2022-10-30 NOTE — Addendum Note (Signed)
Addended by: Kennieth Rad on: 10/30/2022 04:05 PM   Modules accepted: Orders
